# Patient Record
Sex: Female | Born: 1975 | Race: White | Hispanic: No | Marital: Married | Smoking: Former smoker
Health system: Southern US, Community
[De-identification: ages and names within clinical notes are randomized; demographics above are authoritative.]

## PROBLEM LIST (undated history)

## (undated) DIAGNOSIS — F419 Anxiety disorder, unspecified: Secondary | ICD-10-CM

## (undated) DIAGNOSIS — F32A Depression, unspecified: Secondary | ICD-10-CM

## (undated) DIAGNOSIS — D649 Anemia, unspecified: Secondary | ICD-10-CM

## (undated) DIAGNOSIS — R011 Cardiac murmur, unspecified: Secondary | ICD-10-CM

## (undated) DIAGNOSIS — R002 Palpitations: Secondary | ICD-10-CM

## (undated) DIAGNOSIS — F329 Major depressive disorder, single episode, unspecified: Secondary | ICD-10-CM

## (undated) DIAGNOSIS — I10 Essential (primary) hypertension: Secondary | ICD-10-CM

## (undated) DIAGNOSIS — N83209 Unspecified ovarian cyst, unspecified side: Secondary | ICD-10-CM

## (undated) HISTORY — DX: Essential (primary) hypertension: I10

## (undated) HISTORY — DX: Anemia, unspecified: D64.9

## (undated) HISTORY — DX: Anxiety disorder, unspecified: F41.9

## (undated) HISTORY — DX: Depression, unspecified: F32.A

## (undated) HISTORY — PX: CYSTECTOMY: SUR359

## (undated) HISTORY — DX: Palpitations: R00.2

## (undated) HISTORY — DX: Unspecified ovarian cyst, unspecified side: N83.209

## (undated) HISTORY — DX: Major depressive disorder, single episode, unspecified: F32.9

---

## 2000-08-24 ENCOUNTER — Other Ambulatory Visit: Admission: RE | Admit: 2000-08-24 | Discharge: 2000-08-24 | Payer: Self-pay | Admitting: Obstetrics & Gynecology

## 2001-10-13 ENCOUNTER — Other Ambulatory Visit: Admission: RE | Admit: 2001-10-13 | Discharge: 2001-10-13 | Payer: Self-pay | Admitting: Obstetrics & Gynecology

## 2002-05-03 ENCOUNTER — Other Ambulatory Visit: Admission: RE | Admit: 2002-05-03 | Discharge: 2002-05-03 | Payer: Self-pay | Admitting: Obstetrics & Gynecology

## 2002-05-03 ENCOUNTER — Other Ambulatory Visit: Admission: RE | Admit: 2002-05-03 | Discharge: 2002-05-03 | Payer: Self-pay | Admitting: Obstetrics and Gynecology

## 2002-10-23 ENCOUNTER — Other Ambulatory Visit: Admission: RE | Admit: 2002-10-23 | Discharge: 2002-10-23 | Payer: Self-pay | Admitting: Obstetrics & Gynecology

## 2003-06-17 ENCOUNTER — Other Ambulatory Visit: Admission: RE | Admit: 2003-06-17 | Discharge: 2003-06-17 | Payer: Self-pay | Admitting: Obstetrics & Gynecology

## 2003-12-20 ENCOUNTER — Other Ambulatory Visit: Admission: RE | Admit: 2003-12-20 | Discharge: 2003-12-20 | Payer: Self-pay | Admitting: Obstetrics & Gynecology

## 2004-06-23 ENCOUNTER — Other Ambulatory Visit: Admission: RE | Admit: 2004-06-23 | Discharge: 2004-06-23 | Payer: Self-pay | Admitting: Obstetrics and Gynecology

## 2004-12-24 ENCOUNTER — Emergency Department (HOSPITAL_COMMUNITY): Admission: EM | Admit: 2004-12-24 | Discharge: 2004-12-24 | Payer: Self-pay | Admitting: Emergency Medicine

## 2004-12-25 ENCOUNTER — Emergency Department (HOSPITAL_COMMUNITY): Admission: EM | Admit: 2004-12-25 | Discharge: 2004-12-25 | Payer: Self-pay | Admitting: Emergency Medicine

## 2005-03-17 ENCOUNTER — Other Ambulatory Visit: Admission: RE | Admit: 2005-03-17 | Discharge: 2005-03-17 | Payer: Self-pay | Admitting: Obstetrics & Gynecology

## 2006-07-27 ENCOUNTER — Ambulatory Visit: Payer: Self-pay | Admitting: Internal Medicine

## 2006-08-17 ENCOUNTER — Ambulatory Visit: Payer: Self-pay | Admitting: Internal Medicine

## 2007-12-18 ENCOUNTER — Inpatient Hospital Stay (HOSPITAL_COMMUNITY): Admission: AD | Admit: 2007-12-18 | Discharge: 2007-12-18 | Payer: Self-pay | Admitting: Obstetrics and Gynecology

## 2008-04-22 ENCOUNTER — Inpatient Hospital Stay (HOSPITAL_COMMUNITY): Admission: AD | Admit: 2008-04-22 | Discharge: 2008-04-22 | Payer: Self-pay | Admitting: Obstetrics and Gynecology

## 2008-04-26 ENCOUNTER — Inpatient Hospital Stay (HOSPITAL_COMMUNITY): Admission: RE | Admit: 2008-04-26 | Discharge: 2008-04-28 | Payer: Self-pay | Admitting: Obstetrics & Gynecology

## 2008-05-11 ENCOUNTER — Emergency Department (HOSPITAL_COMMUNITY): Admission: EM | Admit: 2008-05-11 | Discharge: 2008-05-12 | Payer: Self-pay | Admitting: Emergency Medicine

## 2010-07-20 LAB — COMPREHENSIVE METABOLIC PANEL
AST: 22 U/L (ref 0–37)
AST: 23 U/L (ref 0–37)
Albumin: 3 g/dL — ABNORMAL LOW (ref 3.5–5.2)
BUN: 5 mg/dL — ABNORMAL LOW (ref 6–23)
CO2: 22 mEq/L (ref 19–32)
CO2: 22 mEq/L (ref 19–32)
Chloride: 103 mEq/L (ref 96–112)
Creatinine, Ser: 0.49 mg/dL (ref 0.4–1.2)
GFR calc Af Amer: >60 mL/min (ref >60)
GFR calc non Af Amer: >60 mL/min (ref >60)
GFR calc non Af Amer: >60 mL/min (ref >60)
Potassium: 4.3 mEq/L (ref 3.5–5.1)
Potassium: 4.3 mEq/L (ref 3.5–5.1)
Sodium: 132 mEq/L — ABNORMAL LOW (ref 135–145)
Total Bilirubin: 0.5 mg/dL (ref 0.3–1.2)
Total Protein: 6.4 g/dL (ref 6.0–8.3)

## 2010-07-20 LAB — CBC
HCT: 42.3 % (ref 36.0–46.0)
MCHC: 32.5 g/dL (ref 30.0–36.0)
MCHC: 33.7 g/dL (ref 30.0–36.0)
MCV: 83.7 fL (ref 78.0–100.0)
Platelets: 179 10*3/uL (ref 150–400)
Platelets: 198 10*3/uL (ref 150–400)
RBC: 3.81 MIL/uL — ABNORMAL LOW (ref 3.87–5.11)
RBC: 4.91 MIL/uL (ref 3.87–5.11)
WBC: 14 10*3/uL — ABNORMAL HIGH (ref 4.0–10.5)
WBC: 19 10*3/uL — ABNORMAL HIGH (ref 4.0–10.5)

## 2010-07-21 LAB — TYPE AND SCREEN
ABO/RH(D): O POS
Antibody Screen: NEGATIVE

## 2010-07-21 LAB — CBC
Platelets: 362 10*3/uL (ref 150–400)
RDW: 16.1 % — ABNORMAL HIGH (ref 11.5–15.5)
WBC: 10.5 10*3/uL (ref 4.0–10.5)

## 2010-07-21 LAB — URINALYSIS, ROUTINE W REFLEX MICROSCOPIC
Bilirubin Urine: NEGATIVE
Hgb urine dipstick: NEGATIVE
Ketones, ur: NEGATIVE mg/dL
Nitrite: NEGATIVE
Specific Gravity, Urine: 1.027 (ref 1.005–1.030)

## 2010-07-21 LAB — DIFFERENTIAL
Basophils Relative: 0 % (ref 0–1)
Eosinophils Absolute: 0.4 10*3/uL (ref 0.0–0.7)
Eosinophils Relative: 3 % (ref 0–5)
Lymphocytes Relative: 37 % (ref 12–46)
Lymphs Abs: 3.8 10*3/uL (ref 0.7–4.0)
Monocytes Absolute: 0.7 10*3/uL (ref 0.1–1.0)
Neutro Abs: 5.6 10*3/uL (ref 1.7–7.7)
Neutrophils Relative %: 53 % (ref 43–77)

## 2010-07-21 LAB — PROTIME-INR: INR: 1.1 (ref 0.00–1.49)

## 2010-08-21 NOTE — Assessment & Plan Note (Signed)
[facility]                         GASTROENTEROLOGY OFFICE NOTE   Leslie, Mathews                     MRN:          [id number]  DATE:07/27/2006                            DOB:          09/20/1975    REASON FOR CONSULTATION:  Hematochezia and Hemoccult positive stool.   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 Clelia Croft regarding rectal bleeding and  Hemoccult positive stool.  The patient reports being in her usual state  of good health until approximately 3 weeks ago, when she began to notice  some red blood on the tissue only.  Subsequently, she noticed red and  maroon blood associated with her stool.  She described it as burgundy or  jelly-like, similar to blood from menstruation.  There was no associated  abdominal or rectal pain.  The problem persisted for about 1 week, then  resolved.  She was seen by Dr. Clelia Croft, who performed rectal examination  with no evidence of fissure or hemorrhoid.  Her stool, however, was  Hemoccult positive.  She also reported about a month ago having had  black tarry stools.  No complaints of abdominal pain.  As stated though,  she had some tenderness in the epigastric region on physical exam.  A  CBC obtained July 21, 2006 revealed a normal hemoglobin at 13.9.  Her  MCV was also normal at 81.2.  Ferritin was normal at 54.1.  Helicobacter  pylori was negative.  Her iron saturation was low at 13%.  Patient has  had no problems this past week with normal looking stools.  She denies  heartburn, dysphagia, or weight loss.  She has had some intermittent  loose stools, which she attributes to anxiety.  She states this has  improved with increasing doses of Lexapro.   PAST MEDICAL HISTORY:  Anxiety with depression.   PAST SURGICAL HISTORY:  LEAP procedure 2 years ago.   ALLERGIES:  NO  KNOWN DRUG ALLERGIES.   CURRENT MEDICATIONS:  Lexapro 20 mg daily.   FAMILY HISTORY:  Negative for gastrointestinal malignancy or  inflammatory bowel disease.   SOCIAL HISTORY:  Patient is married without children.  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  She smokes 5  cigarettes per day, and has 2 alcoholic beverages at night.  Mentions  occasional marijuana use.   REVIEW OF SYSTEMS:  Per diagnostic evaluation form.   PHYSICAL EXAMINATION:  Well-appearing female, in no acute distress.  Blood pressure 122/84.  Heart rate is 80. Weight is 171.8 pounds.  She  is 5 feet 6 inches in height.  HEENT:  Sclerae anicteric.  Conjunctivae are pink.  Oral mucosa intact.  No adenopathy.  LUNGS:  Clear.  HEART:  Regular.  ABDOMEN:  Soft without tenderness, mass or hernia.  Good bowel sounds  heard.  RECTAL EXAM:  Per Dr. Clelia Croft July 21, 2006.  Not repeated.  EXTREMITIES:  Without edema.   IMPRESSION:  This is a 35 year old female with recent problems with  lower gastrointestinal bleeding without obvious cause.  As well,  transient dark stools raising the question of melena.  Her hemoglobin is  normal, and her ferritin normal.  The iron saturation is slightly low  raising the possible question of chronic blood loss.  She should be  evaluated to rule out colon polyp or neoplasia.   RECOMMENDATIONS:  Colonoscopy.  Ashby Dawes of the procedure, as well the  risks, benefits, and alternatives have been reviewed.  She understood  and agreed to proceed.  If colonoscopy negative, then I would indeed  proceed with upper endoscopy to address possible melena in a Hemoccult  positive stool.  We discussed this as well.     Wilhemina Bonito. Marina Goodell, MD  Electronically Signed    JNP/MedQ  DD: 07/27/2006  DT: 07/27/2006  Job #: [id number]   cc:   Kari Baars, M.D.

## 2011-01-06 LAB — URINALYSIS, ROUTINE W REFLEX MICROSCOPIC
Nitrite: NEGATIVE
Protein, ur: NEGATIVE
Urobilinogen, UA: 0.2

## 2011-02-12 ENCOUNTER — Ambulatory Visit: Payer: BC Managed Care – PPO | Attending: Gynecology | Admitting: Gynecology

## 2011-02-12 ENCOUNTER — Encounter: Payer: Self-pay | Admitting: Gynecology

## 2011-02-12 VITALS — BP 124/80 | HR 80 | Temp 99.0°F | Resp 14 | Ht 67.72 in | Wt 190.5 lb

## 2011-02-12 DIAGNOSIS — N83209 Unspecified ovarian cyst, unspecified side: Secondary | ICD-10-CM

## 2011-02-12 DIAGNOSIS — N949 Unspecified condition associated with female genital organs and menstrual cycle: Secondary | ICD-10-CM | POA: Insufficient documentation

## 2011-02-12 DIAGNOSIS — R109 Unspecified abdominal pain: Secondary | ICD-10-CM | POA: Insufficient documentation

## 2011-02-12 DIAGNOSIS — N839 Noninflammatory disorder of ovary, fallopian tube and broad ligament, unspecified: Secondary | ICD-10-CM | POA: Insufficient documentation

## 2011-02-12 HISTORY — DX: Unspecified ovarian cyst, unspecified side: N83.209

## 2011-02-12 NOTE — Progress Notes (Signed)
Consult Note: Gyn-Onc  Leslie Mathews 35 y.o. female  CC:  Chief Complaint  Patient presents with  . Cyst    New pt     HPI:  35 year old white married female gravida 1 who is seen in consultation at the request of Dr. Varney Baas regarding the management of a complex adnexal mass.  The patient has had lower abdominal and pelvic pain nearly continuously since the birth of her daughter 3 years ago. This is somewhat positional especially when she is recumbent. She was seen recently for evaluation by Dr. Jennette Kettle who obtained an ultrasound that showed an enlarged left ovary measuring 3.4 x 2.8 x 3.4 cm. The mass was partially cystic and partially solid. It was thought to possibly be a dermoid there was no internal blood flow within the mass. There is no free fluid. She did have an OVA-1 which measured 5.4 which is slightly higher than normal for premenopausal patient.  The patient has a past gynecologic history of an abnormal Pap smear undergoing what sounds like a LEEP procedure. Subsequent Paps have been normal. She does not have a family history of ovarian or breast cancer . Her mother had endometriosis. The patient denies that her pain is cyclic but is pretty much continuously present. She denies any other GI or GU symptoms and overall her functional status is excellent.      Review of Systems: 10 point review of systems is negative except as noted above   Current Meds:  Outpatient Encounter Prescriptions as of 02/12/2011  Medication Sig Dispense Refill  . etonogestrel-ethinyl estradiol (NUVARING) 0.12-0.015 MG/24HR vaginal ring Place 1 each vaginally every 28 (twenty-eight) days. Insert vaginally and leave in place for 3 consecutive weeks, then remove for 1 week.       Marland Kitchen HYDROcodone-ibuprofen (VICOPROFEN) 7.5-200 MG per tablet Take 0.5 tablets by mouth daily as needed.          Allergy:  Allergies  Allergen Reactions  . Penicillins Rash    As a child    Social Hx:   History    Social History  . Marital Status: Married    Spouse Name: N/A    Number of Children: N/A  . Years of Education: N/A   Occupational History  . Not on file.   Social History Main Topics  . Smoking status: Former Smoker    Quit date: 02/11/1997  . Smokeless tobacco: Not on file  . Alcohol Use: 3.5 oz/week    7 drink(s) per week     Glass of wine everyday  . Drug Use: No  . Sexually Active: Yes   Other Topics Concern  . Not on file   Social History Narrative  . No narrative on file    Past Surgical Hx: History reviewed. No pertinent past surgical history.  Past Medical Hx:  Past Medical History  Diagnosis Date  . Anemia     in teenage yrs  . Anxiety     Depression in the past  . Delivery normal   . Heart palpitations     Family Hx:  Family History  Problem Relation Age of Onset  . Hypertension Mother   . ALS Mother   . Hyperlipidemia Mother   . Diabetes Father   . Multiple myeloma Father   . Hyperlipidemia Brother   . Hypertension Brother     Vitals:  Blood pressure 124/80, pulse 80, temperature 99 F (37.2 C), temperature source Oral, resp. rate 14, height 5' 7.72" (1.72 m), weight  190 lb 8 oz (86.41 kg), last menstrual period 02/10/2011.  Physical Exam: the patient is a healthy white female in no acute distress  HEENT is negative  Neck is supple without thyromegaly  There is no supraclavicular or inguinal adenopathy.  The abdomen is obese soft nontender no mass or megaly ascites or hernias are noted.  Pelvic exam  EGBUS vagina bladder and urethra are normal  The cervix appears normal  Uterus is anterior normal shape size and consistency.  Adnexa are without palpable masses.  Lower extremities without edema or varicosities   Assessment/Plan: complex adnexal mass in  a premenopausal patient. 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 Her chronic pelvic  pain may point towards endometriosis.  I have contacted Dr. Konrad Dolores and have encouraged him to proceed with surgery. I discussed with the patient that is unlikely this is a malignancy and therefore ovarian cystectomy was most reasonable. If, in fact, this does return as ovarian cancer on final pathology we would proceed with staging at another time. Patient is in agreement with this plan and will contact Dr. Jennette Kettle to arrange a surgical date.    Jeannette Corpus, MD 02/12/2011, 5:15 PM

## 2011-02-12 NOTE — Patient Instructions (Signed)
Contact Dr. Donnetta Hail office to schedule surgery

## 2011-02-17 ENCOUNTER — Other Ambulatory Visit: Payer: Self-pay | Admitting: Obstetrics & Gynecology

## 2011-02-17 ENCOUNTER — Other Ambulatory Visit (HOSPITAL_COMMUNITY)
Admission: RE | Admit: 2011-02-17 | Discharge: 2011-02-17 | Disposition: A | Discharge status: Home / Self Care | Payer: BC Managed Care – PPO | Source: Ambulatory Visit | Attending: Obstetrics & Gynecology | Admitting: Obstetrics & Gynecology

## 2011-02-17 DIAGNOSIS — N83209 Unspecified ovarian cyst, unspecified side: Secondary | ICD-10-CM | POA: Insufficient documentation

## 2011-03-12 ENCOUNTER — Emergency Department (HOSPITAL_COMMUNITY)
Admission: EM | Admit: 2011-03-12 | Discharge: 2011-03-12 | Disposition: A | Discharge status: Home / Self Care | Payer: BC Managed Care – PPO | Attending: Emergency Medicine | Admitting: Emergency Medicine

## 2011-03-12 ENCOUNTER — Encounter (HOSPITAL_COMMUNITY): Payer: Self-pay | Admitting: Emergency Medicine

## 2011-03-12 DIAGNOSIS — R51 Headache: Secondary | ICD-10-CM | POA: Insufficient documentation

## 2011-03-12 DIAGNOSIS — I10 Essential (primary) hypertension: Secondary | ICD-10-CM | POA: Insufficient documentation

## 2011-03-12 DIAGNOSIS — F410 Panic disorder [episodic paroxysmal anxiety] without agoraphobia: Secondary | ICD-10-CM

## 2011-03-12 DIAGNOSIS — T887XXA Unspecified adverse effect of drug or medicament, initial encounter: Secondary | ICD-10-CM | POA: Insufficient documentation

## 2011-03-12 DIAGNOSIS — IMO0002 Reserved for concepts with insufficient information to code with codable children: Secondary | ICD-10-CM

## 2011-03-12 DIAGNOSIS — R110 Nausea: Secondary | ICD-10-CM | POA: Insufficient documentation

## 2011-03-12 NOTE — ED Notes (Signed)
AVW:[id number]<JY> Expected date:03/12/11<BR> Expected time: 1:23 PM<BR> Means of arrival:Ambulance<BR> Comments:<BR> M100 - 67 yoM Abd inj/Fall CA Pt

## 2011-03-12 NOTE — ED Notes (Signed)
Pt left with friend and walked to d/c window. Pt stated "I feel better now".

## 2011-03-12 NOTE — ED Provider Notes (Signed)
History     CSN: [id number] Arrival date & time: 03/12/2011  1:19 PM   First MD Initiated Contact with Patient 03/12/11 1514      Chief Complaint  Patient presents with  . Medication Reaction    (Consider location/radiation/quality/duration/timing/severity/associated sxs/prior treatment) Patient is a 35 y.o. female presenting with mental health disorder. The history is provided by the patient.  Mental Health Problem Primary symptoms comment: panic attack The current episode started today. This is a recurrent problem.  The onset of the illness is precipitated by a stressful event (took 1/2 tramadol and 2 excedrin tension today and felt very jittery which sent her into a panic attack). The degree of incapacity that she is experiencing as a consequence of her illness is mild. Additional symptoms of the illness include headaches. She does not admit to suicidal ideas. She does not contemplate harming herself. She has not already injured self. She has not already  injured another person. Risk factors: hx of panic attacks.    Past Medical History  Diagnosis Date  . Anemia     in teenage yrs  . Anxiety     Depression in the past  . Delivery normal   . Heart palpitations   . Ovarian cyst 02/12/2011    Past Surgical History  Procedure Date  . Cystectomy     Family History  Problem Relation Age of Onset  . Hypertension Mother   . ALS Mother   . Hyperlipidemia Mother   . Diabetes Father   . Multiple myeloma Father   . Hyperlipidemia Brother   . Hypertension Brother     History  Substance Use Topics  . Smoking status: Former Smoker    Quit date: 02/11/1997  . Smokeless tobacco: Not on file  . Alcohol Use: 3.5 oz/week    7 drink(s) per week     Glass of wine everyday    OB History    Grav Para Term Preterm Abortions TAB SAB Ect Mult Living                  Review of Systems  HENT: Negative for neck pain.   Eyes: Negative for photophobia and visual disturbance.    Respiratory: Negative for cough and shortness of breath.   Cardiovascular: Negative for chest pain.  Gastrointestinal: Positive for nausea. Negative for vomiting and diarrhea.  Neurological: Positive for headaches.       Woke up this am with a headache which has not improved.  Causing mild nausea     Allergies  Penicillins  Home Medications   Current Outpatient Rx  Name Route Sig Dispense Refill  . ACETAMINOPHEN-CAFFEINE 500-65 MG PO TABS Oral Take 2 tablets by mouth daily as needed. migraine     . ALPRAZOLAM 0.5 MG PO TABS Oral Take 0.5 mg by mouth every 8 (eight) hours. Pt takes 1/2 to 1 tab as needed     . ETONOGESTREL-ETHINYL ESTRADIOL 0.12-0.015 MG/24HR VA RING Vaginal Place 1 each vaginally every 28 (twenty-eight) days. Insert vaginally and leave in place for 3 consecutive weeks, then remove for 1 week.     Marland Kitchen HYDROCODONE-IBUPROFEN 7.5-200 MG PO TABS Oral Take 0.5 tablets by mouth daily as needed.      Marland Kitchen TRAMADOL HCL 50 MG PO TBDP Oral Take 50 mg by mouth daily.        BP 137/86  Pulse 86  Temp(Src) 98.7 F (37.1 C) (Oral)  Resp 18  SpO2 100%  LMP 02/10/2011  Physical  Exam  Nursing note and vitals reviewed. Constitutional: She is oriented to person, place, and time. She appears well-developed and well-nourished. No distress.  HENT:  Head: Normocephalic and atraumatic.  Eyes: EOM are normal. Pupils are equal, round, and reactive to light.  Cardiovascular: Normal rate, regular rhythm, normal heart sounds and intact distal pulses.  Exam reveals no friction rub.   No murmur heard. Pulmonary/Chest: Effort normal and breath sounds normal. She has no wheezes. She has no rales.  Abdominal: Soft. Bowel sounds are normal. She exhibits no distension. There is no tenderness. There is no rebound and no guarding.  Musculoskeletal: Normal range of motion. She exhibits no tenderness.       No edema  Neurological: She is alert and oriented to person, place, and time. No cranial nerve  deficit.  Skin: Skin is warm and dry. No rash noted.  Psychiatric: She has a normal mood and affect. Her behavior is normal. Judgment and thought content normal.    ED Course  Procedures (including critical care time)  Labs Reviewed - No data to display No results found.   No diagnosis found.    MDM   Pt states she is having a panic attack after taking 1/2 tramadol and 2 excedrin tension.  She states she thinks it is due to all the caffiene she has had today.  Feeling better now but states still a little jittery.  Pt states HA is a classic tension HA.  No migraine sx and no sx suggestive of SAH.  Pt is well appearing and states she is starting to calm down.  Pt has hx of panic attacks and usually takes xanax when they get bad but was scared to take xanax today due to the other meds.  Exam wnl. Will d/c pt home as she is improving on her own and normal VS.       Gwyneth Sprout, MD 03/12/11 [phone]

## 2011-03-12 NOTE — ED Notes (Signed)
Pt presenting to ed with c/o taking possibly a 1/2 tramadol and 2 excedrin tension headache. Pt states she thought it would help her headache pt with c/o feeling, nauseous, feels like she needs to have a bowel movement. Pt states she feels like she is about to have a panic attack. Pt states she continues to have a headache

## 2013-01-18 ENCOUNTER — Other Ambulatory Visit: Payer: Self-pay | Admitting: Dermatology

## 2013-09-25 ENCOUNTER — Encounter: Payer: Self-pay | Admitting: Internal Medicine

## 2013-10-11 ENCOUNTER — Ambulatory Visit (INDEPENDENT_AMBULATORY_CARE_PROVIDER_SITE_OTHER): Payer: BC Managed Care – PPO | Admitting: Family Medicine

## 2013-10-11 ENCOUNTER — Telehealth: Payer: Self-pay

## 2013-10-11 VITALS — BP 118/76 | HR 76 | Temp 99.1°F | Resp 16 | Ht 65.25 in | Wt 174.2 lb

## 2013-10-11 DIAGNOSIS — J029 Acute pharyngitis, unspecified: Secondary | ICD-10-CM

## 2013-10-11 LAB — POCT RAPID STREP A (OFFICE): RAPID STREP A SCREEN: NEGATIVE

## 2013-10-11 MED ORDER — AZITHROMYCIN 250 MG PO TABS: ORAL_TABLET | ORAL | Status: DC

## 2013-10-11 MED ORDER — CLINDAMYCIN HCL 300 MG PO CAPS: 300.0000 mg | ORAL_CAPSULE | Freq: Three times a day (TID) | ORAL | Status: DC

## 2013-10-11 NOTE — Patient Instructions (Signed)
We are going to treat you for strep throat with azithromycin.  I will be in touch with your throat culture when it comes in.  Drink plenty of fluids and take ibuprofen and/or tylenol as needed.    Let me know if you are feeling worse or have any change in your symptoms.

## 2013-10-11 NOTE — Telephone Encounter (Signed)
Pt was here today and received a rx for Zithromax and went to the pool and then started to notice a red rash. She said she has taken this medication before with no issues. She's not sure if it is a reaction or not. She can be reached at [phone]. Thank you

## 2013-10-11 NOTE — Progress Notes (Addendum)
[facility] [address], [place] [address] [phone]  Date:  10/11/2013   Name:  Leslie Mathews   DOB:  November 14, 1975   MRN:  [id number]  PCP:  No PCP Per Patient    Chief Complaint: Sore Throat   History of Present Illness:  Leslie Mathews is a 38 y.o. very pleasant female patient who presents with the following:  She has noted a ST, low grade fever and body aches for 2 to 3 days.  Her temp has gotten up to around 100 at home.   No other sx that she has noted.  No cough, runny nose, sneezing, nausea or vomiting.   She is generally healthy pcn allergic - able to tolerate a zpack ok She is on nuvaring for contraception  Patient Active Problem List   Diagnosis Date Noted  . Ovarian cyst 02/12/2011    Past Medical History  Diagnosis Date  . Anemia     in teenage yrs  . Anxiety     Depression in the past  . Delivery normal   . Heart palpitations   . Ovarian cyst 02/12/2011    Past Surgical History  Procedure Laterality Date  . Cystectomy      History  Substance Use Topics  . Smoking status: Former Smoker    Quit date: 02/11/1997  . Smokeless tobacco: Not on file  . Alcohol Use: 3.5 oz/week    7 drink(s) per week     Comment: Glass of wine everyday    Family History  Problem Relation Age of Onset  . Hypertension Mother   . ALS Mother   . Hyperlipidemia Mother   . Diabetes Father   . Multiple myeloma Father   . Hyperlipidemia Brother   . Hypertension Brother     Allergies  Allergen Reactions  . Penicillins Rash    As a child    Medication list has been reviewed and updated.  Current Outpatient Prescriptions on File Prior to Visit  Medication Sig Dispense Refill  . ALPRAZolam (XANAX) 0.5 MG tablet Take 0.5 mg by mouth every 8 (eight) hours. Pt takes 1/2 to 1 tab as needed       . etonogestrel-ethinyl estradiol (NUVARING) 0.12-0.015 MG/24HR vaginal ring Place 1 each vaginally every 28 (twenty-eight) days. Insert vaginally  and leave in place for 3 consecutive weeks, then remove for 1 week.       . Acetaminophen-Caffeine (EXCEDRIN TENSION HEADACHE) 500-65 MG TABS Take 2 tablets by mouth daily as needed. migraine       . HYDROcodone-ibuprofen (VICOPROFEN) 7.5-200 MG per tablet Take 0.5 tablets by mouth daily as needed.        . TraMADol HCl 50 MG TBDP Take 50 mg by mouth daily.         No current facility-administered medications on file prior to visit.    Review of Systems:  As per HPI- otherwise negative.   Physical Examination: Filed Vitals:   10/11/13 0951  BP: 118/76  Pulse: 76  Temp: 99.1 F (37.3 C)  Resp: 16   Filed Vitals:   10/11/13 0951  Height: 5' 5.25" (1.657 m)  Weight: 174 lb 3.2 oz (79.017 kg)   Body mass index is 28.78 kg/(m^2). Ideal Body Weight: Weight in (lb) to have BMI = 25: 151.1  GEN: WDWN, NAD, Non-toxic, A & O x 3, looks well HEENT: Atraumatic, Normocephalic. Neck supple. No masses, No LAD.  Bilateral TM wnl, oropharynx shows inflammation and exudate.  PEERL,EOMI.   Ears and Nose: No external deformity. CV: RRR, No M/G/R. No JVD. No thrill. No extra heart sounds. PULM: CTA B, no wheezes, crackles, rhonchi. No retractions. No resp. distress. No accessory muscle use. ABD: S, NT, ND EXTR: No c/c/e NEURO Normal gait.  PSYCH: Normally interactive. Conversant. Not depressed or anxious appearing.  Calm demeanor.   Results for orders placed in visit on 10/11/13  POCT RAPID STREP A (OFFICE)      Result Value Ref Range   Rapid Strep A Screen Negative  Negative    Assessment and Plan: Acute pharyngitis, unspecified pharyngitis type - Plan: POCT rapid strep A, Culture, Group A Strep, azithromycin (ZITHROMAX) 250 MG tablet  Treat for likely strep throat with azithromycin due to pcn allergy.  Await culture and will follow-up with her.    Signed Lamar Blinks, MD  7/11:  Results for orders placed in visit on 10/11/13  CULTURE, GROUP A STREP      Result Value Ref  Range   Organism ID, Bacteria Normal Upper Respiratory Flora     Organism ID, Bacteria No Beta Hemolytic Streptococci Isolated    POCT RAPID STREP A (OFFICE)      Result Value Ref Range   Rapid Strep A Screen Negative  Negative   Called and LMOM- throat culture negative for strep.  Finish abx and let me know if not better

## 2013-10-11 NOTE — Telephone Encounter (Signed)
Called to discuss with her.  She is not having any SOB or hives, the rash is not itchy.  However will have her DC the azithromycin and due clindamycin instead.  She may have a mild allergy to azithromycin so she will cautious of using this in the future.

## 2013-10-11 NOTE — Telephone Encounter (Signed)
Spoke to pt- she states the rash is everywhere. She is still in the water and at the pool. But when she is in the shade it seems it is getting better. She says it looks like she has little pimples all over her body. She denies any SOB, itching or hive appearance. She has taken a Z pak before and did not have any reaction. She is also taking a B complex and Magnesium and a multi-vitiman. She is wondering if they might have an interaction. Should she proceed with treatment or stop medication. Advised her to stop and we would call her back before she  Needed to take the second dose.

## 2013-10-13 LAB — CULTURE, GROUP A STREP: Organism ID, Bacteria: NORMAL

## 2013-12-30 ENCOUNTER — Ambulatory Visit (INDEPENDENT_AMBULATORY_CARE_PROVIDER_SITE_OTHER): Payer: BC Managed Care – PPO | Admitting: Internal Medicine

## 2013-12-30 ENCOUNTER — Encounter: Payer: Self-pay | Admitting: Internal Medicine

## 2013-12-30 VITALS — HR 61 | Temp 98.2°F | Resp 16 | Ht 65.5 in | Wt 177.6 lb

## 2013-12-30 DIAGNOSIS — R1031 Right lower quadrant pain: Secondary | ICD-10-CM

## 2013-12-30 LAB — POCT UA - MICROSCOPIC ONLY
Bacteria, U Microscopic: NEGATIVE
Casts, Ur, LPF, POC: NEGATIVE
Crystals, Ur, HPF, POC: NEGATIVE
Mucus, UA: NEGATIVE
YEAST UA: NEGATIVE

## 2013-12-30 LAB — POCT URINALYSIS DIPSTICK
Bilirubin, UA: NEGATIVE
GLUCOSE UA: NEGATIVE
Ketones, UA: NEGATIVE
LEUKOCYTES UA: NEGATIVE
NITRITE UA: NEGATIVE
Protein, UA: NEGATIVE
Spec Grav, UA: 1.02
Urobilinogen, UA: 0.2
pH, UA: 6

## 2013-12-30 MED ORDER — MELOXICAM 15 MG PO TABS: 15.0000 mg | ORAL_TABLET | Freq: Every day | ORAL | Status: DC

## 2013-12-30 NOTE — Progress Notes (Signed)
Subjective:   This chart was scribed for Ellamae Sia, MD by Jarvis Morgan, Medical Scribe. This patient was seen in Room 2 and the patient's care was started at 3:17 PM.   Patient ID: Leslie Mathews, female    DOB: 10/01/1975, 38 y.o.   MRN: [id number]  Chief Complaint  Patient presents with  . Back Pain    x 1 day   . Abdominal Pain    x 1 day     HPI HPI Comments: Leslie Mathews is a 38 y.o. female who presents to the [facility] complaining of excruciating back and abdominal pain for 1 day. She states that the pain. Starts in her back and radiates to her abdomen. The pain in her abdomen is localized in the right side and periumbilical region. She states that it felt like menstrual cramps but she does not typically have menstrual cramps. She is due to start her menstrual period this week. She notes some associated increase in frequency, she does note she drinks a lot of water so that could be contributory, as well as nausea. Has a h/o ovarian cysts. She denies any fever, emesis, dysuria, hematuria, or flank pain.   Review of Systems A complete 10 system review of systems was obtained and all systems are negative except as noted in the HPI and PMH.      Objective:   Physical Exam  Nursing note and vitals reviewed. Constitutional: She is oriented to person, place, and time. She appears well-developed and well-nourished. No distress.  NAD  HENT:  Head: Normocephalic and atraumatic.  Eyes: Conjunctivae and EOM are normal.  Neck: Neck supple. No tracheal deviation present.  Cardiovascular: Normal rate.   Pulmonary/Chest: Effort normal. No respiratory distress.  Abdominal: Soft. Bowel sounds are normal. She exhibits no distension and no mass. There is tenderness. There is no rebound and no guarding.  Tenderness to palpation in RLQ.   Genitourinary: Vagina normal and uterus normal. Uterus is not enlarged and not tender. Right adnexum displays tenderness.  Right adnexum displays no mass. Left adnexum displays no mass and no tenderness.  External exam normal  Musculoskeletal: Normal range of motion.  Neurological: She is alert and oriented to person, place, and time.  Skin: Skin is warm and dry.  Psychiatric: She has a normal mood and affect. Her behavior is normal.    Filed Vitals:   12/30/13 1346  Pulse: 61  Resp: 16  Height: 5' 5.5" (1.664 m)  Weight: 177 lb 9.6 oz (80.559 kg)  SpO2: 99%   Results for orders placed in visit on 12/30/13  POCT UA - MICROSCOPIC ONLY      Result Value Ref Range   WBC, Ur, HPF, POC 0-1     RBC, urine, microscopic 0-2     Bacteria, U Microscopic neg     Mucus, UA neg     Epithelial cells, urine per micros 0-1     Crystals, Ur, HPF, POC neg     Casts, Ur, LPF, POC neg     Yeast, UA neg    POCT URINALYSIS DIPSTICK      Result Value Ref Range   Color, UA yellow     Clarity, UA clear     Glucose, UA neg     Bilirubin, UA neg     Ketones, UA neg     Spec Grav, UA 1.020     Blood, UA small     pH, UA 6.0  Protein, UA neg     Urobilinogen, UA 0.2     Nitrite, UA neg     Leukocytes, UA Negative          Assessment & Plan:  Right lower quadrant abdominal pain - Plan: POCT UA - Microscopic Only, POCT urinalysis dipstick  Due to ruptured Ovar cyst Meds ordered this encounter  Medications  . meloxicam (MOBIC) 15 MG tablet    Sig: Take 1 tablet (15 mg total) by mouth daily.    Dispense:  10 tablet    Refill:  0   F/u 1 week for imaging if not well

## 2014-01-04 ENCOUNTER — Other Ambulatory Visit: Payer: Self-pay | Admitting: Internal Medicine

## 2014-01-04 DIAGNOSIS — R110 Nausea: Secondary | ICD-10-CM

## 2014-01-04 DIAGNOSIS — R1084 Generalized abdominal pain: Secondary | ICD-10-CM

## 2014-01-07 ENCOUNTER — Other Ambulatory Visit: Payer: BC Managed Care – PPO

## 2014-09-24 ENCOUNTER — Other Ambulatory Visit: Payer: Self-pay | Admitting: Obstetrics and Gynecology

## 2014-09-30 LAB — CYTOLOGY - PAP

## 2016-08-09 DIAGNOSIS — R030 Elevated blood-pressure reading, without diagnosis of hypertension: Secondary | ICD-10-CM | POA: Diagnosis not present

## 2016-08-09 DIAGNOSIS — R002 Palpitations: Secondary | ICD-10-CM | POA: Diagnosis not present

## 2016-08-09 DIAGNOSIS — R011 Cardiac murmur, unspecified: Secondary | ICD-10-CM | POA: Diagnosis not present

## 2016-09-01 DIAGNOSIS — Z01419 Encounter for gynecological examination (general) (routine) without abnormal findings: Secondary | ICD-10-CM | POA: Diagnosis not present

## 2016-09-15 DIAGNOSIS — M545 Low back pain: Secondary | ICD-10-CM | POA: Diagnosis not present

## 2016-10-05 ENCOUNTER — Encounter: Payer: Self-pay | Admitting: Genetics

## 2016-10-05 ENCOUNTER — Other Ambulatory Visit: Payer: BC Managed Care – PPO

## 2016-10-05 ENCOUNTER — Telehealth: Payer: Self-pay | Admitting: Genetics

## 2016-10-05 ENCOUNTER — Encounter: Payer: BC Managed Care – PPO | Admitting: Genetics

## 2016-10-05 NOTE — Telephone Encounter (Signed)
Ms. Nicholaus BloomKelley called back to discuss her genetic counseling appointment further.  I explained that she does not meet any criteria for cancer genetic testing based on the information she told me about her family history.  I explained that if she wishes to discuss her family history of ALS, the adult genetics clinic at [facility] may be most appropriate.  If she wishes to make this referral, she should call her physician's office and they can facilitate that referral.   I explained examples of the type of indications we perform cancer genetic testing for, and that because she does not meet cancer genetic testing criteria, there is a less than 1% chance that she would be positive for a mutation in a cancer predisposition gene.  She could pursue genetic testing with a patient pay option if she desires.  Based in this information, Ms. Nicholaus BloomKelley elected to cancel her cancer genetic counseling appointment, and will consider asking for [facility] referral to [facility].

## 2016-10-05 NOTE — Telephone Encounter (Signed)
I verified with [name] that the reason she was referred to see genetic counseling was for her mother's diagnosis of ALS and her father's diagnosis of Multiple Myeloma.  She verified this was correct and said the only other family history of cancer was her maternal grandfather who had cancer, but the type is unknown.    I informed [name] that based on this information she does not meed any medical criteria for genetic testing, and there isn't any genetic testing specifically for Multiple Myeloma that we could offer her.  If she would still like a family risk assessment and genetic testing (paid out of pocket) she could still come in, but it is not medically indicated.  I also mentioned that if she wanted to talk to a genetics provider about ALS that she may want to see an Adult Genetics clinic.  She asked if I could call her back, as she was not able to write any information down and it was not a good time to talk further.  I told her I would call back in about an hr.

## 2016-11-09 DIAGNOSIS — R102 Pelvic and perineal pain: Secondary | ICD-10-CM | POA: Diagnosis not present

## 2016-11-24 ENCOUNTER — Encounter: Payer: Self-pay | Admitting: Internal Medicine

## 2016-11-24 DIAGNOSIS — K921 Melena: Secondary | ICD-10-CM | POA: Diagnosis not present

## 2016-12-03 DIAGNOSIS — Z0000 Encounter for general adult medical examination without abnormal findings: Secondary | ICD-10-CM | POA: Diagnosis not present

## 2016-12-03 DIAGNOSIS — Z1231 Encounter for screening mammogram for malignant neoplasm of breast: Secondary | ICD-10-CM | POA: Diagnosis not present

## 2016-12-03 DIAGNOSIS — N390 Urinary tract infection, site not specified: Secondary | ICD-10-CM | POA: Diagnosis not present

## 2016-12-10 ENCOUNTER — Ambulatory Visit (INDEPENDENT_AMBULATORY_CARE_PROVIDER_SITE_OTHER): Payer: 59 | Admitting: Nurse Practitioner

## 2016-12-10 ENCOUNTER — Encounter: Payer: Self-pay | Admitting: Nurse Practitioner

## 2016-12-10 VITALS — BP 110/84 | HR 76 | Ht 65.35 in | Wt 208.1 lb

## 2016-12-10 DIAGNOSIS — K625 Hemorrhage of anus and rectum: Secondary | ICD-10-CM | POA: Diagnosis not present

## 2016-12-10 DIAGNOSIS — R194 Change in bowel habit: Secondary | ICD-10-CM | POA: Diagnosis not present

## 2016-12-10 DIAGNOSIS — R1084 Generalized abdominal pain: Secondary | ICD-10-CM

## 2016-12-10 NOTE — Patient Instructions (Signed)
If you are age 40 or older, your body mass index should be between 23-30. Your Body mass index is 34.26 kg/m. If this is out of the aforementioned range listed, please consider follow up with your Primary Care Provider.  If you are age 364 or younger, your body mass index should be between 19-25. Your Body mass index is 34.26 kg/m. If this is out of the aformentioned range listed, please consider follow up with your Primary Care Provider.   You have been scheduled for a colonoscopy. Please follow written instructions given to you at your visit today.  Please pick up your prep supplies at the pharmacy within the next 1-3 days. If you use inhalers (even only as needed), please bring them with you on the day of your procedure. Your physician has requested that you go to www.startemmi.com and enter the access code given to you at your visit today. This web site gives a general overview about your procedure. However, you should still follow specific instructions given to you by our office regarding your preparation for the procedure.  Thank you for choosing [facility]  Dr Amada JupiterHenry Danis III

## 2016-12-10 NOTE — Progress Notes (Signed)
HPI: Patient is a 41 year old female known remotely to Dr. Henrene Pastor from a colonoscopy in 2008 for evaluation of hematochezia. Exam was normal, no polyps or cancers found.  Wilder is referred by PCP, Dr. Brigitte Pulse for rectal bleeding. She givesa several month history of intermittent lower abdominal pain.  Pain is described as an ache, worse when driving for prolonged periods of time. She also has lower back pain but this has been more of a chronic issue. Transvaginal ultrasound unremarkable per patient.  Lately her stools have been unformed and more frequent. She has sensation of incomplete evacuation despite 2-3 bowel movements a day. Her only recent medication changes as a changed her birth control. She recently had blood in stool over a several day period.  No weight loss, she has gained some weight.  She has no urinary symptoms. No joint aches or skin rashes.   Recent CBC was unremarkable with a hemoglobin of 13.4.  Past Medical History:  Diagnosis Date  . Anemia    in teenage yrs  . Anxiety    Depression in the past  . Delivery normal   . Depression   . Heart palpitations   . HTN (hypertension)   . Ovarian cyst 02/12/2011     Past Surgical History:  Procedure Laterality Date  . CYSTECTOMY     Family History  Problem Relation Age of Onset  . Hypertension Mother   . ALS Mother   . Hyperlipidemia Mother   . Diabetes Father   . Multiple myeloma Father   . Hyperlipidemia Brother   . Hypertension Brother    Social History  Substance Use Topics  . Smoking status: Former Smoker    Start date: 02/11/1997    Quit date: 04/06/2007  . Smokeless tobacco: Never Used  . Alcohol use 3.5 oz/week    7 Standard drinks or equivalent per week     Comment: Glass of wine everyday   Current Outpatient Prescriptions  Medication Sig Dispense Refill  . ALPRAZolam (XANAX) 0.5 MG tablet Take 0.5 mg by mouth as needed. Pt takes 1/2 to 1 tab as needed     . B Complex Vitamins (VITAMIN B  COMPLEX PO) Take by mouth.    Marland Kitchen MAGNESIUM CARBONATE PO Take by mouth.    . meloxicam (MOBIC) 15 MG tablet Take 1 tablet (15 mg total) by mouth daily. (Patient taking differently: Take 15 mg by mouth as needed. ) 10 tablet 0  . methocarbamol (ROBAXIN) 500 MG tablet Take 1 tablet by mouth daily.    . Multiple Vitamin (MULTI VITAMIN DAILY PO) Take by mouth.    . norethindrone (MICRONOR,CAMILA,ERRIN) 0.35 MG tablet Take 1 tablet by mouth daily.    . sertraline (ZOLOFT) 50 MG tablet Take 50 mg by mouth daily.    . traMADol (ULTRAM) 50 MG tablet Take 1 tablet by mouth as needed.     No current facility-administered medications for this visit.    Allergies  Allergen Reactions  . Azithromycin     Rash- mild  . Penicillins Rash    As a child     Review of Systems: All systems reviewed and negative except where noted in HPI.    Physical Exam: BP 110/84 (BP Location: Left Arm, Patient Position: Sitting, Cuff Size: Normal)   Pulse 76   Ht 5' 5.35" (1.66 m) Comment: height measured without shoes  Wt 208 lb 2 oz (94.4 kg)   BMI 34.26 kg/m  Constitutional:  Well-developed,  white female in no acute distress. Psychiatric: Normal mood and affect. Behavior is normal. EENT: Pupils normal.  Conjunctivae are normal. No scleral icterus. Neck supple.  Cardiovascular: Normal rate, regular rhythm. No edema Pulmonary/chest: Effort normal and breath sounds normal. No wheezing, rales or rhonchi. Abdominal: Soft, nondistended. Nontender. Bowel sounds active throughout. There are no masses palpable. No hepatomegaly. Lymphadenopathy: No cervical adenopathy noted. Neurological: Alert and oriented to person place and time. Skin: Skin is warm and dry. No rashes noted.   ASSESSMENT AND PLAN:  41 year old female referred by PCP for rectal bleeding. She has associated and lower abdominal discomfort and bowel changes in form of increased frequency of unformed stools.  -for further evaluation atient will be  scheduled for a colonoscopy with possible biopsies / polypectomy.  The risks and benefits of the procedure were discussed and the patient agrees to proceed.    Tye Savoy, NP  12/10/2016, 8:57 AM  Cc:  Marton Redwood, MD

## 2016-12-16 NOTE — Progress Notes (Signed)
Initial assessment and plans reviewed 

## 2017-01-04 DIAGNOSIS — Z23 Encounter for immunization: Secondary | ICD-10-CM | POA: Diagnosis not present

## 2017-01-25 ENCOUNTER — Encounter: Payer: Self-pay | Admitting: Internal Medicine

## 2017-01-31 DIAGNOSIS — S92355A Nondisplaced fracture of fifth metatarsal bone, left foot, initial encounter for closed fracture: Secondary | ICD-10-CM | POA: Diagnosis not present

## 2017-02-01 ENCOUNTER — Telehealth: Payer: Self-pay | Admitting: Internal Medicine

## 2017-02-01 NOTE — Telephone Encounter (Signed)
Spoke with pt and she has surgery scheduled for 02/10/17 for her broken foot. Pt wanted to know if she should be concerned about having colon done 12/17. Discussed with her that this should not be an issue as the sedation here is different and she would not be intubated.

## 2017-02-08 ENCOUNTER — Encounter: Payer: 59 | Admitting: Internal Medicine

## 2017-02-09 HISTORY — PX: FOOT FRACTURE SURGERY: SHX645

## 2017-02-10 DIAGNOSIS — S92352A Displaced fracture of fifth metatarsal bone, left foot, initial encounter for closed fracture: Secondary | ICD-10-CM | POA: Diagnosis not present

## 2017-02-21 DIAGNOSIS — S92355D Nondisplaced fracture of fifth metatarsal bone, left foot, subsequent encounter for fracture with routine healing: Secondary | ICD-10-CM | POA: Diagnosis not present

## 2017-02-28 ENCOUNTER — Telehealth: Payer: Self-pay | Admitting: Internal Medicine

## 2017-02-28 MED ORDER — NA SULFATE-K SULFATE-MG SULF 17.5-3.13-1.6 GM/177ML PO SOLN: 1.0000 | Freq: Once | ORAL | 0 refills | Status: AC

## 2017-02-28 NOTE — Telephone Encounter (Signed)
Suprep sent to [facility]

## 2017-03-16 ENCOUNTER — Telehealth: Payer: Self-pay | Admitting: Internal Medicine

## 2017-03-16 NOTE — Telephone Encounter (Signed)
Instructions up front for pick up.

## 2017-03-19 ENCOUNTER — Telehealth: Payer: Self-pay | Admitting: Physician Assistant

## 2017-03-21 ENCOUNTER — Other Ambulatory Visit: Payer: Self-pay

## 2017-03-21 ENCOUNTER — Encounter: Payer: Self-pay | Admitting: Internal Medicine

## 2017-03-21 ENCOUNTER — Ambulatory Visit (AMBULATORY_SURGERY_CENTER): Payer: 59 | Admitting: Internal Medicine

## 2017-03-21 VITALS — BP 121/64 | HR 68 | Temp 98.0°F | Resp 18 | Ht 65.35 in | Wt 208.0 lb

## 2017-03-21 DIAGNOSIS — Z1211 Encounter for screening for malignant neoplasm of colon: Secondary | ICD-10-CM | POA: Diagnosis not present

## 2017-03-21 DIAGNOSIS — K625 Hemorrhage of anus and rectum: Secondary | ICD-10-CM | POA: Diagnosis present

## 2017-03-21 DIAGNOSIS — K649 Unspecified hemorrhoids: Secondary | ICD-10-CM

## 2017-03-21 DIAGNOSIS — R194 Change in bowel habit: Secondary | ICD-10-CM | POA: Diagnosis not present

## 2017-03-21 MED ORDER — SODIUM CHLORIDE 0.9 % IV SOLN: 500.0000 mL | Freq: Once | INTRAVENOUS | Status: DC

## 2017-03-21 NOTE — Op Note (Signed)
[facility] Patient Name: Leslie Mathews Procedure Date: 03/21/2017 3:33 PM MRN: [id number] Endoscopist: Wilhemina BonitoJohn N. Marina GoodellPerry , MD Age: [age] Referring MD:  Date of Birth: July 07, 1975 Gender: Female Account #: [id number] Procedure:                Colonoscopy Indications:              Lower abdominal pain, Clinically significant                            diarrhea of unexplained origin, Rectal bleeding,                            Change in bowel habits. Since office visit, bowel                            habits have improved and rectal bleeding resolved.                            Lower abdominal discomfort is at her baseline Medicines:                Monitored Anesthesia Care Procedure:                Pre-Anesthesia Assessment:                           - Prior to the procedure, a History and Physical                            was performed, and patient medications and                            allergies were reviewed. The patient's tolerance of                            previous anesthesia was also reviewed. The risks                            and benefits of the procedure and the sedation                            options and risks were discussed with the patient.                            All questions were answered, and informed consent                            was obtained. Prior Anticoagulants: The patient has                            taken no previous anticoagulant or antiplatelet                            agents. ASA Grade Assessment: II - A patient with  mild systemic disease. After reviewing the risks                            and benefits, the patient was deemed in                            satisfactory condition to undergo the procedure.                           After obtaining informed consent, the colonoscope                            was passed under direct vision. Throughout the                            procedure, the  patient's blood pressure, pulse, and                            oxygen saturations were monitored continuously. The                            Colonoscope was introduced through the anus and                            advanced to the the cecum, identified by                            appendiceal orifice and ileocecal valve. The                            terminal ileum, ileocecal valve, appendiceal                            orifice, and rectum were photographed. The quality                            of the bowel preparation was excellent. The                            colonoscopy was performed without difficulty. The                            patient tolerated the procedure well. The bowel                            preparation used was SUPREP. Scope In: 3:43:13 PM Scope Out: 3:51:57 PM Scope Withdrawal Time: 0 hours 7 minutes 4 seconds  Total Procedure Duration: 0 hours 8 minutes 44 seconds  Findings:                 The terminal ileum appeared normal.                           Internal hemorrhoids were found during retroflexion.  The exam was otherwise without abnormality on                            direct and retroflexion views. Complications:            No immediate complications. Estimated blood loss:                            None. Estimated Blood Loss:     Estimated blood loss: none. Impression:               - The examined portion of the ileum was normal.                           - Internal hemorrhoids.                           - The examination was otherwise normal on direct                            and retroflexion views.                           - No specimens collected. Recommendation:           - Repeat colonoscopy in 10 years for screening                            purposes.                           - Patient has a contact number available for                            emergencies. The signs and symptoms of potential                             delayed complications were discussed with the                            patient. Return to normal activities tomorrow.                            Written discharge instructions were provided to the                            patient.                           - Resume previous diet.                           - Continue present medications. Wilhemina Bonito. Marina Goodell, MD 03/21/2017 3:58:53 PM This report has been signed electronically.

## 2017-03-21 NOTE — Patient Instructions (Signed)
YOU HAD AN ENDOSCOPIC PROCEDURE TODAY AT THE [facility]:   Refer to the procedure report that was given to you for any specific questions about what was found during the examination.  If the procedure report does not answer your questions, please call your gastroenterologist to clarify.  If you requested that your care partner not be given the details of your procedure findings, then the procedure report has been included in a sealed envelope for you to review at your convenience later.  YOU SHOULD EXPECT: Some feelings of bloating in the abdomen. Passage of more gas than usual.  Walking can help get rid of the air that was put into your GI tract during the procedure and reduce the bloating. If you had a lower endoscopy (such as a colonoscopy or flexible sigmoidoscopy) you may notice spotting of blood in your stool or on the toilet paper. If you underwent a bowel prep for your procedure, you may not have a normal bowel movement for a few days.  Please Note:  You might notice some irritation and congestion in your nose or some drainage.  This is from the oxygen used during your procedure.  There is no need for concern and it should clear up in a day or so.  SYMPTOMS TO REPORT IMMEDIATELY:   Following lower endoscopy (colonoscopy or flexible sigmoidoscopy):  Excessive amounts of blood in the stool  Significant tenderness or worsening of abdominal pains  Swelling of the abdomen that is new, acute  Fever of 100F or higher    For urgent or emergent issues, a gastroenterologist can be reached at any hour by calling [phone].   DIET:  We do recommend a small meal at first, but then you may proceed to your regular diet.  Drink plenty of fluids but you should avoid alcoholic beverages for 24 hours.  ACTIVITY:  You should plan to take it easy for the rest of today and you should NOT DRIVE or use heavy machinery until tomorrow (because of the sedation medicines used during the test).     FOLLOW UP: Our staff will call the number listed on your records the next business day following your procedure to check on you and address any questions or concerns that you may have regarding the information given to you following your procedure. If we do not reach you, we will leave a message.  However, if you are feeling well and you are not experiencing any problems, there is no need to return our call.  We will assume that you have returned to your regular daily activities without incident.  If any biopsies were taken you will be contacted by phone or by letter within the next 1-3 weeks.  Please call us at [phone] if you have not heard about the biopsies in 3 weeks.    SIGNATURES/CONFIDENTIALITY: You and/or your care partner have signed paperwork which will be entered into your electronic medical record.  These signatures attest to the fact that that the information above on your After Visit Summary has been reviewed and is understood.  Full responsibility of the confidentiality of this discharge information lies with you and/or your care-partner.   Resume medications. Information given on hemorrhoids. 

## 2017-03-22 ENCOUNTER — Telehealth: Payer: Self-pay

## 2017-03-22 NOTE — Telephone Encounter (Signed)
  Follow up Call-  Call back number 03/21/2017  Post procedure Call Back phone  # [phone]  Permission to leave phone message Yes  Some recent data might be hidden     Patient questions:  Do you have a fever, pain , or abdominal swelling? No. Pain Score  0 *  Have you tolerated food without any problems? Yes.    Have you been able to return to your normal activities? Yes.    Do you have any questions about your discharge instructions: Diet   No. Medications  No. Follow up visit  No.  Do you have questions or concerns about your Care? No.  Actions: * If pain score is 4 or above: No action needed, pain <4.

## 2017-04-05 ENCOUNTER — Encounter (HOSPITAL_COMMUNITY): Payer: Self-pay | Admitting: Emergency Medicine

## 2017-04-05 ENCOUNTER — Emergency Department (HOSPITAL_COMMUNITY): Payer: 59

## 2017-04-05 ENCOUNTER — Emergency Department (HOSPITAL_COMMUNITY)
Admission: EM | Admit: 2017-04-05 | Discharge: 2017-04-05 | Discharge status: Against Medical Advice | Payer: 59 | Attending: Emergency Medicine | Admitting: Emergency Medicine

## 2017-04-05 ENCOUNTER — Other Ambulatory Visit: Payer: Self-pay

## 2017-04-05 DIAGNOSIS — Z5321 Procedure and treatment not carried out due to patient leaving prior to being seen by health care provider: Secondary | ICD-10-CM | POA: Insufficient documentation

## 2017-04-05 DIAGNOSIS — R079 Chest pain, unspecified: Secondary | ICD-10-CM | POA: Insufficient documentation

## 2017-04-05 DIAGNOSIS — M79601 Pain in right arm: Secondary | ICD-10-CM | POA: Diagnosis not present

## 2017-04-05 DIAGNOSIS — M79602 Pain in left arm: Secondary | ICD-10-CM | POA: Diagnosis not present

## 2017-04-05 NOTE — ED Triage Notes (Signed)
Patient c/o bilat arm pain for week that is constant, intermittent upper back pain and intermittent central chest pain. Denies any injuries that could cause the pains.

## 2017-04-05 NOTE — ED Triage Notes (Signed)
Patient adds that over past month had issues with fingers going numb.

## 2017-07-26 DIAGNOSIS — J309 Allergic rhinitis, unspecified: Secondary | ICD-10-CM | POA: Diagnosis not present

## 2017-07-26 DIAGNOSIS — J029 Acute pharyngitis, unspecified: Secondary | ICD-10-CM | POA: Diagnosis not present

## 2017-08-01 DIAGNOSIS — J069 Acute upper respiratory infection, unspecified: Secondary | ICD-10-CM | POA: Diagnosis not present

## 2017-08-01 DIAGNOSIS — J029 Acute pharyngitis, unspecified: Secondary | ICD-10-CM | POA: Diagnosis not present

## 2017-09-05 DIAGNOSIS — Z6834 Body mass index (BMI) 34.0-34.9, adult: Secondary | ICD-10-CM | POA: Diagnosis not present

## 2017-09-05 DIAGNOSIS — Z01419 Encounter for gynecological examination (general) (routine) without abnormal findings: Secondary | ICD-10-CM | POA: Diagnosis not present

## 2017-12-02 DIAGNOSIS — R82998 Other abnormal findings in urine: Secondary | ICD-10-CM | POA: Diagnosis not present

## 2017-12-02 DIAGNOSIS — Z0000 Encounter for general adult medical examination without abnormal findings: Secondary | ICD-10-CM | POA: Diagnosis not present

## 2017-12-08 DIAGNOSIS — Z1231 Encounter for screening mammogram for malignant neoplasm of breast: Secondary | ICD-10-CM | POA: Diagnosis not present

## 2017-12-09 DIAGNOSIS — R030 Elevated blood-pressure reading, without diagnosis of hypertension: Secondary | ICD-10-CM | POA: Diagnosis not present

## 2017-12-09 DIAGNOSIS — Z0000 Encounter for general adult medical examination without abnormal findings: Secondary | ICD-10-CM | POA: Diagnosis not present

## 2017-12-09 DIAGNOSIS — E781 Pure hyperglyceridemia: Secondary | ICD-10-CM | POA: Diagnosis not present

## 2017-12-09 DIAGNOSIS — Z23 Encounter for immunization: Secondary | ICD-10-CM | POA: Diagnosis not present

## 2017-12-09 DIAGNOSIS — Z1389 Encounter for screening for other disorder: Secondary | ICD-10-CM | POA: Diagnosis not present

## 2018-02-27 DIAGNOSIS — R002 Palpitations: Secondary | ICD-10-CM | POA: Diagnosis not present

## 2018-02-27 DIAGNOSIS — R030 Elevated blood-pressure reading, without diagnosis of hypertension: Secondary | ICD-10-CM | POA: Diagnosis not present

## 2018-03-14 DIAGNOSIS — I10 Essential (primary) hypertension: Secondary | ICD-10-CM | POA: Diagnosis not present

## 2018-03-14 DIAGNOSIS — J069 Acute upper respiratory infection, unspecified: Secondary | ICD-10-CM | POA: Diagnosis not present

## 2018-03-14 DIAGNOSIS — R002 Palpitations: Secondary | ICD-10-CM | POA: Diagnosis not present

## 2018-03-27 DIAGNOSIS — Z6832 Body mass index (BMI) 32.0-32.9, adult: Secondary | ICD-10-CM | POA: Diagnosis not present

## 2018-03-27 DIAGNOSIS — I10 Essential (primary) hypertension: Secondary | ICD-10-CM | POA: Diagnosis not present

## 2018-05-15 DIAGNOSIS — R52 Pain, unspecified: Secondary | ICD-10-CM | POA: Diagnosis not present

## 2018-05-15 DIAGNOSIS — J111 Influenza due to unidentified influenza virus with other respiratory manifestations: Secondary | ICD-10-CM | POA: Diagnosis not present

## 2018-08-14 DIAGNOSIS — B349 Viral infection, unspecified: Secondary | ICD-10-CM | POA: Diagnosis not present

## 2018-08-14 DIAGNOSIS — J029 Acute pharyngitis, unspecified: Secondary | ICD-10-CM | POA: Diagnosis not present

## 2018-09-08 IMAGING — CR DG CHEST 2V
2 series · 2 of 2 positions shown · non-contrast
Comparison: None.

CLINICAL DATA: Bilateral arm pain

EXAM:
CHEST  2 VIEW

[w chest pa]
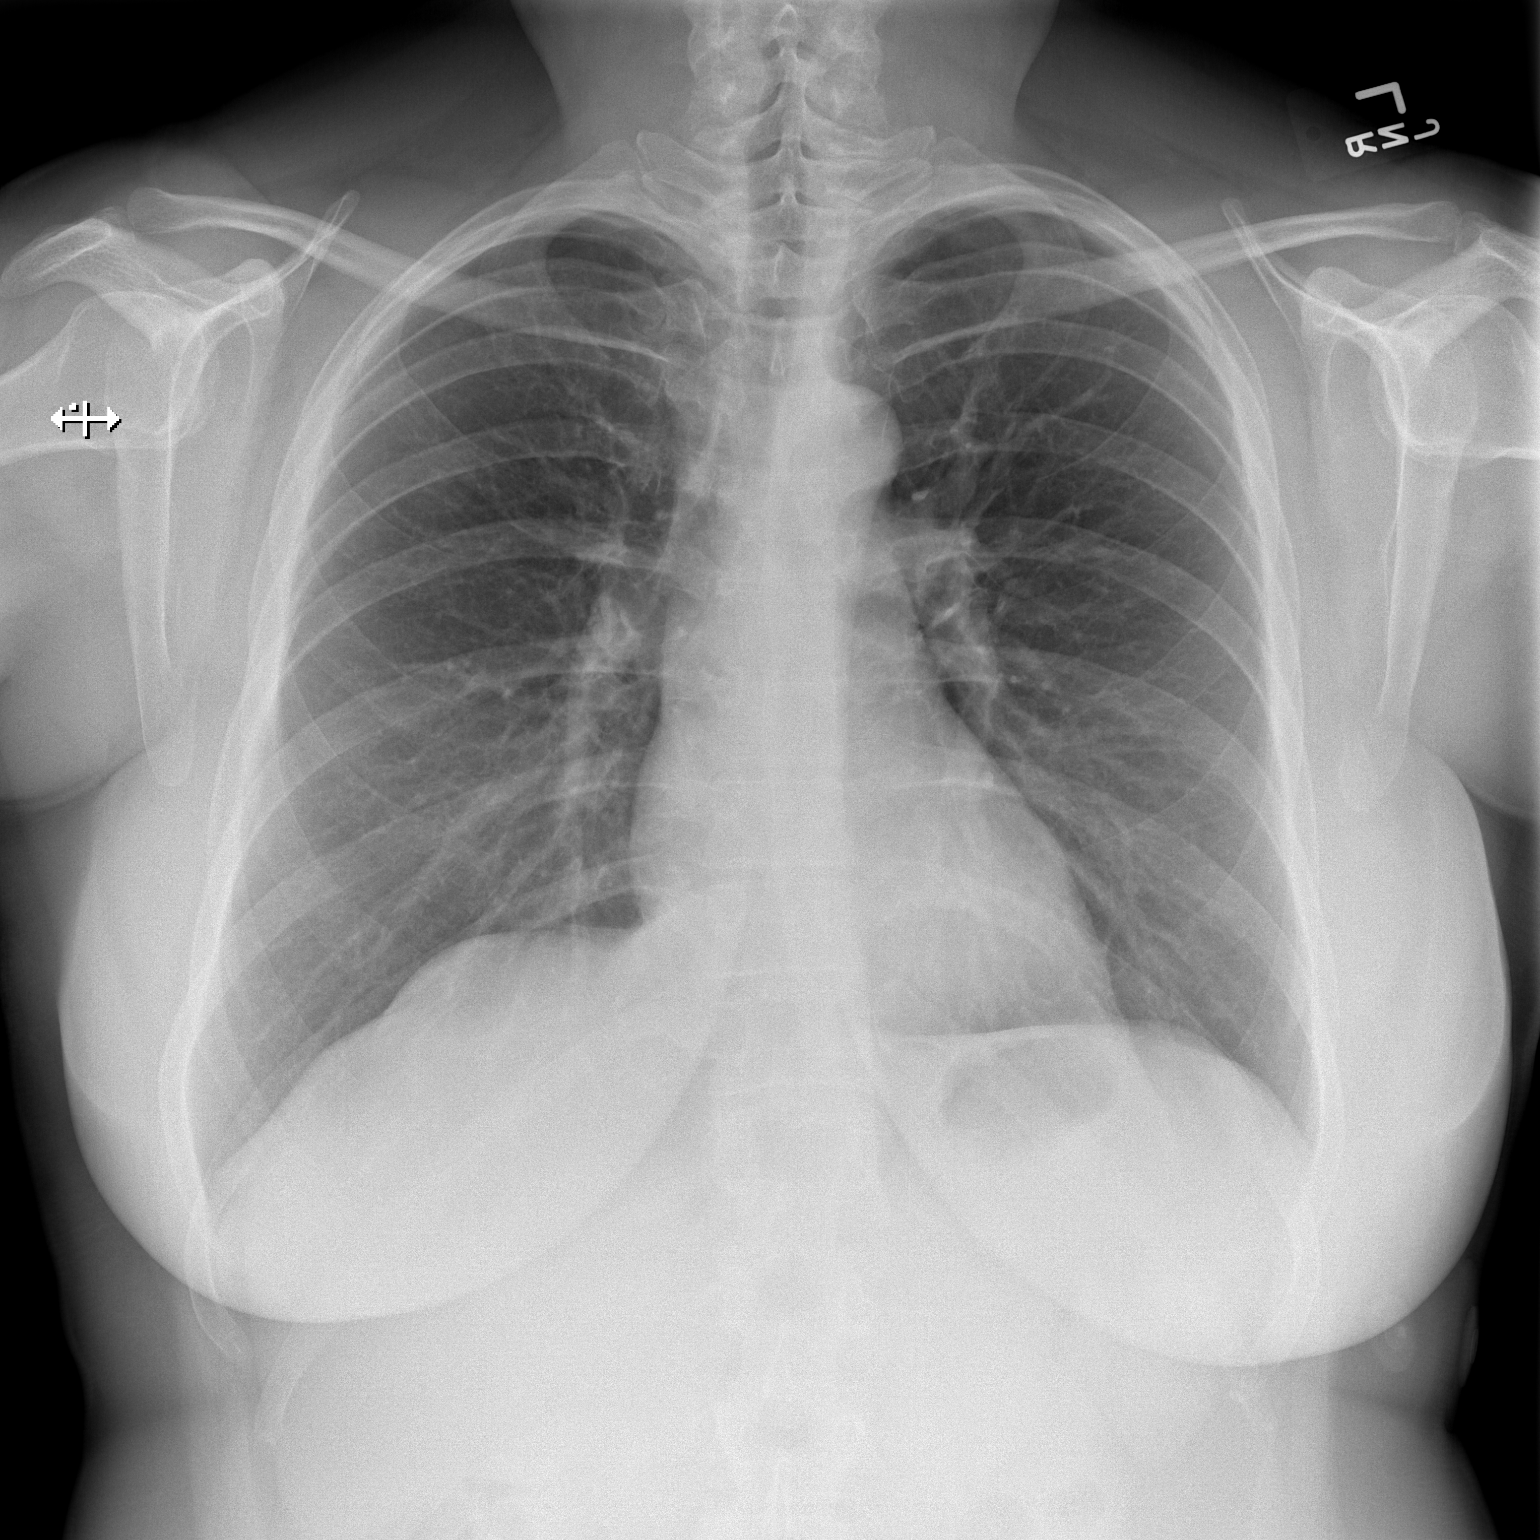

[w chest lat]
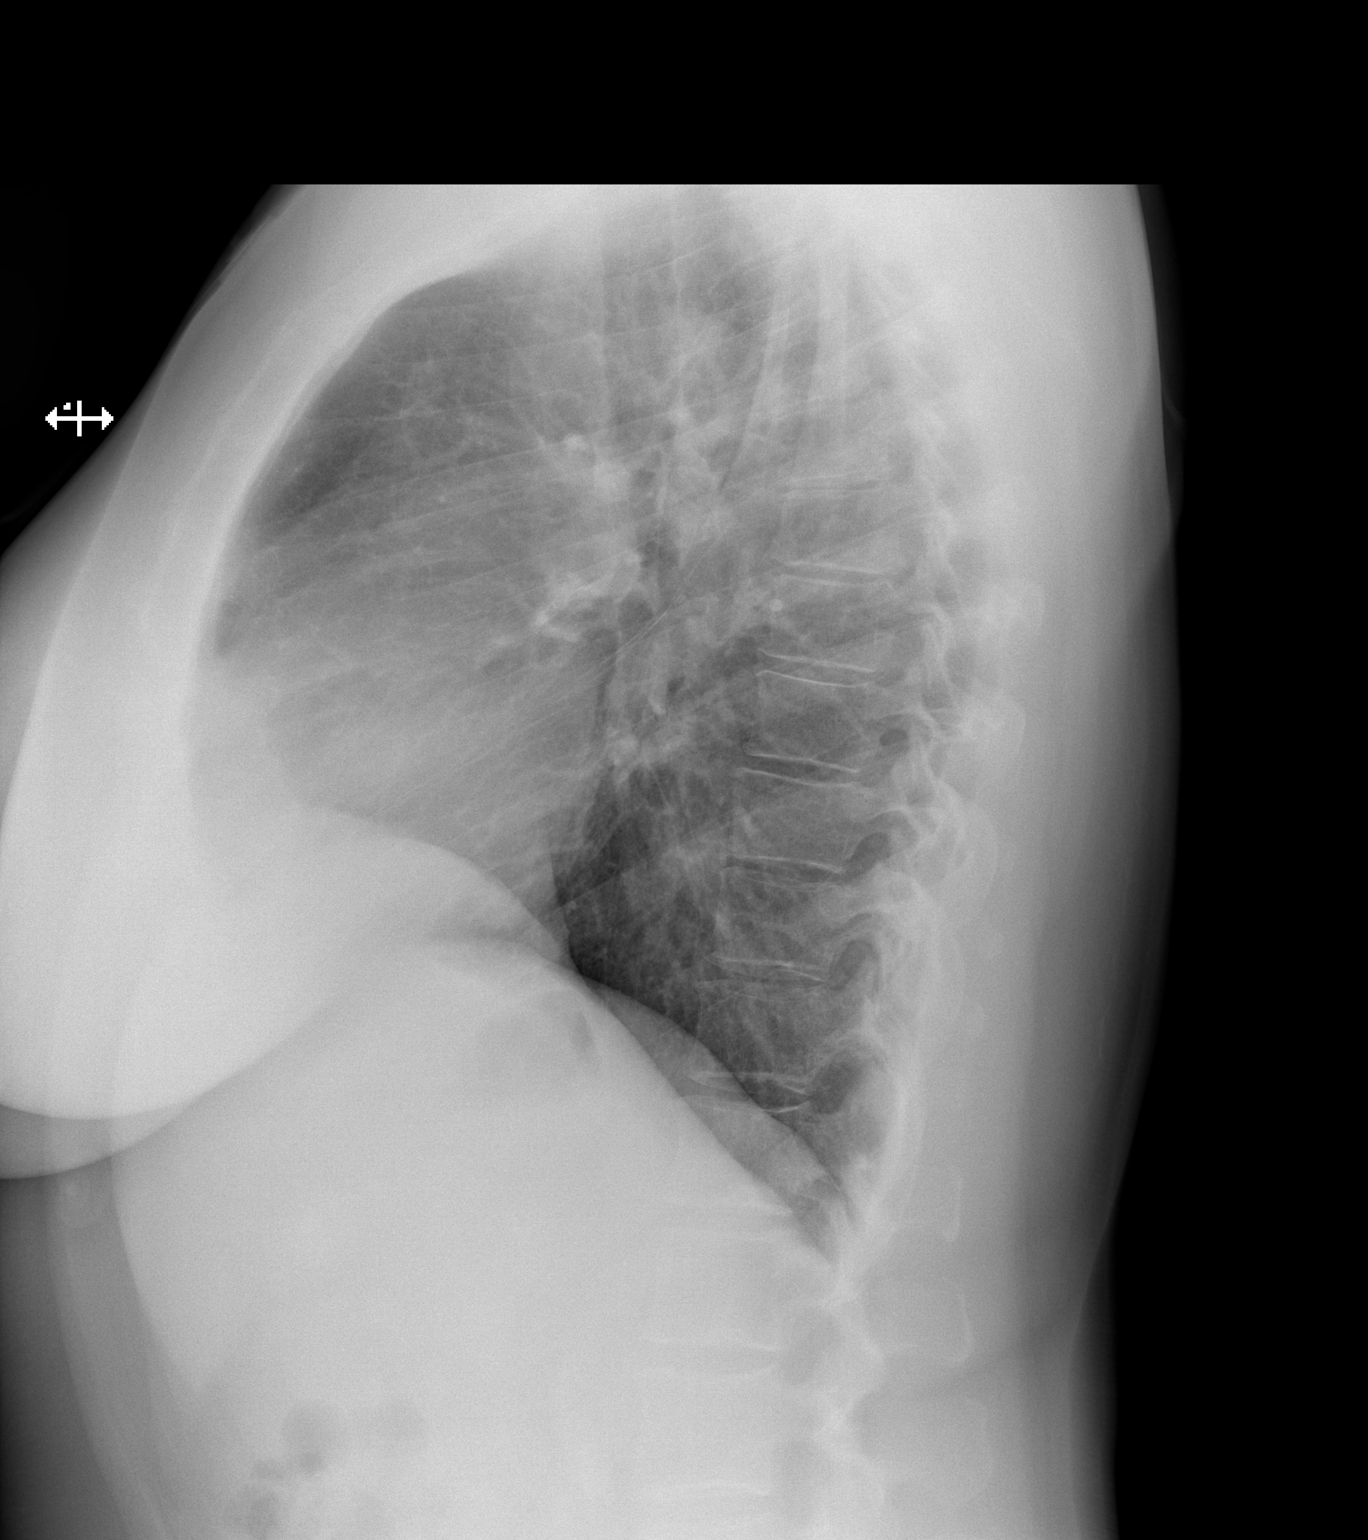

[2 of 2 positions shown; findings below may reference images not displayed]

FINDINGS: The heart size and mediastinal contours are within normal limits.
Both lungs are clear. The visualized skeletal structures are
unremarkable.
IMPRESSION: No active cardiopulmonary disease.

## 2019-11-12 ENCOUNTER — Encounter (HOSPITAL_COMMUNITY): Payer: Self-pay

## 2019-11-12 ENCOUNTER — Other Ambulatory Visit: Payer: Self-pay

## 2019-11-12 ENCOUNTER — Emergency Department (HOSPITAL_COMMUNITY)
Admission: EM | Admit: 2019-11-12 | Discharge: 2019-11-12 | Disposition: A | Discharge status: Home / Self Care | Payer: BC Managed Care – PPO | Attending: Emergency Medicine | Admitting: Emergency Medicine

## 2019-11-12 ENCOUNTER — Emergency Department (HOSPITAL_COMMUNITY): Payer: BC Managed Care – PPO

## 2019-11-12 DIAGNOSIS — R197 Diarrhea, unspecified: Secondary | ICD-10-CM | POA: Insufficient documentation

## 2019-11-12 DIAGNOSIS — Z87891 Personal history of nicotine dependence: Secondary | ICD-10-CM | POA: Diagnosis not present

## 2019-11-12 DIAGNOSIS — R1011 Right upper quadrant pain: Secondary | ICD-10-CM | POA: Diagnosis present

## 2019-11-12 DIAGNOSIS — I10 Essential (primary) hypertension: Secondary | ICD-10-CM | POA: Insufficient documentation

## 2019-11-12 DIAGNOSIS — R945 Abnormal results of liver function studies: Secondary | ICD-10-CM | POA: Diagnosis not present

## 2019-11-12 DIAGNOSIS — Z79899 Other long term (current) drug therapy: Secondary | ICD-10-CM | POA: Insufficient documentation

## 2019-11-12 DIAGNOSIS — K8020 Calculus of gallbladder without cholecystitis without obstruction: Secondary | ICD-10-CM | POA: Insufficient documentation

## 2019-11-12 DIAGNOSIS — R932 Abnormal findings on diagnostic imaging of liver and biliary tract: Secondary | ICD-10-CM

## 2019-11-12 LAB — COMPREHENSIVE METABOLIC PANEL
ALT: 19 U/L (ref 0–44)
AST: 20 U/L (ref 15–41)
Albumin: 4.4 g/dL (ref 3.5–5.0)
Alkaline Phosphatase: 45 U/L (ref 38–126)
Anion gap: 9 (ref 5–15)
BUN: 15 mg/dL (ref 6–20)
CO2: 22 mmol/L (ref 22–32)
Calcium: 8.9 mg/dL (ref 8.9–10.3)
Chloride: 101 mmol/L (ref 98–111)
Creatinine, Ser: 0.7 mg/dL (ref 0.44–1.00)
GFR calc Af Amer: >60 mL/min (ref >60)
GFR calc non Af Amer: >60 mL/min (ref >60)
Glucose, Bld: 95 mg/dL (ref 70–99)
Potassium: 4.1 mmol/L (ref 3.5–5.1)
Sodium: 132 mmol/L — ABNORMAL LOW (ref 135–145)
Total Bilirubin: 0.3 mg/dL (ref 0.3–1.2)
Total Protein: 7.8 g/dL (ref 6.5–8.1)

## 2019-11-12 LAB — LIPASE, BLOOD: Lipase: 36 U/L (ref 11–51)

## 2019-11-12 LAB — URINALYSIS, ROUTINE W REFLEX MICROSCOPIC
Bilirubin Urine: NEGATIVE
Glucose, UA: NEGATIVE mg/dL
Ketones, ur: NEGATIVE mg/dL
Leukocytes,Ua: NEGATIVE
Nitrite: NEGATIVE
Protein, ur: NEGATIVE mg/dL
Specific Gravity, Urine: 1.015 (ref 1.005–1.030)
pH: 5 (ref 5.0–8.0)

## 2019-11-12 LAB — CBC
HCT: 45 % (ref 36.0–46.0)
Hemoglobin: 14.1 g/dL (ref 12.0–15.0)
MCH: 27.6 pg (ref 26.0–34.0)
MCHC: 31.3 g/dL (ref 30.0–36.0)
MCV: 88.2 fL (ref 80.0–100.0)
Platelets: 247 10*3/uL (ref 150–400)
RBC: 5.1 MIL/uL (ref 3.87–5.11)
RDW: 15.2 % (ref 11.5–15.5)
WBC: 9.4 10*3/uL (ref 4.0–10.5)
nRBC: 0 % (ref 0.0–0.2)

## 2019-11-12 LAB — I-STAT BETA HCG BLOOD, ED (MC, WL, AP ONLY): I-stat hCG, quantitative: <5 m[IU]/mL (ref <5)

## 2019-11-12 MED ORDER — SODIUM CHLORIDE 0.9% FLUSH: 3.0000 mL | Freq: Once | INTRAVENOUS | Status: DC

## 2019-11-12 MED ORDER — HYDROCODONE-ACETAMINOPHEN 5-325 MG PO TABS: 1.0000 | ORAL_TABLET | Freq: Four times a day (QID) | ORAL | 0 refills | Status: DC | PRN

## 2019-11-12 NOTE — ED Provider Notes (Signed)
[facility] DEPT Provider Note   CSN: [id number] Arrival date & time: 11/12/19  5056     History Chief Complaint  Patient presents with  . Abdominal Pain    Leslie Mathews is a 44 y.o. female with PMHx HTN, depression, ovarian cyst with PSHx of cystectomy who presents to the ED today with complaint of gradual onset, constant, dully/achy, diffuse abdominal pain (R > L) x 6 months. Pt reports that she has been in constant pain for the past 6 months however seems to have worsened over the past week. She states she woke up out of her sleep last night in severe pain. Pt took a muscle relaxer to try to go back to sleep which did help however when she woke up her pain persisted. She states that the pain will sometimes radiate up into her back and into her shoulder blades. Pt reports the pain does seem more localized to her right side however it has been difficult to discern as of late given it feels like it radiates all over. She also reports issues with her bowel movements for the past several months - pt states that she will typically expect to have diarrhea about 30-45 minutes after eating any type of meal. She estimates about 5-6 episodes per day. Pt did recently have her yearly pap smear with OBGYN and had a full pelvic exam with reassuring findings; her OBGYN has referred her to [employer] however her appointment is not until mid September. Pt denies fevers, chills, nausea, vomiting, melena, urinary symptoms, pelvic pain, vaginal discharge, or any other associated symptoms. She is currently on her menses. Denies heavy EtOH use. No heavy NSAID use. Pt does typically have BRBPR - she reports she has had 2 colonoscopies in the past related to this with findings of internal hemorrhoids.   The history is provided by the patient and medical records.       Past Medical History:  Diagnosis Date  . Anemia    in teenage yrs  . Anxiety    Depression in the past  . Delivery  normal   . Depression   . Heart palpitations   . HTN (hypertension)   . Ovarian cyst 02/12/2011    Patient Active Problem List   Diagnosis Date Noted  . Ovarian cyst 02/12/2011    Past Surgical History:  Procedure Laterality Date  . CYSTECTOMY    . FOOT FRACTURE SURGERY Left 02/09/2017   5th metatarsal     OB History   No obstetric history on file.     Family History  Problem Relation Age of Onset  . Hypertension Mother   . ALS Mother   . Hyperlipidemia Mother   . Diabetes Father   . Multiple myeloma Father   . Hyperlipidemia Brother   . Hypertension Brother   . Colon polyps Brother   . Colon cancer Neg Hx   . Rectal cancer Neg Hx     Social History   Tobacco Use  . Smoking status: Former Smoker    Start date: 02/11/1997    Quit date: 04/06/2007    Years since quitting: 12.6  . Smokeless tobacco: Never Used  Vaping Use  . Vaping Use: Never used  Substance Use Topics  . Alcohol use: Yes    Alcohol/week: 7.0 standard drinks    Types: 7 Standard drinks or equivalent per week    Comment: Glass of wine everyday  . Drug use: No    Home Medications Prior to  Admission medications   Medication Sig Start Date End Date Taking? Authorizing Provider  ALPRAZolam Duanne Moron) 0.5 MG tablet Take 0.5 mg by mouth as needed. Pt takes 1/2 to 1 tab as needed     [provider]  B Complex Vitamins (VITAMIN B COMPLEX PO) Take by mouth.    [provider]  HYDROcodone-acetaminophen (NORCO/VICODIN) 5-325 MG tablet Take 1 tablet by mouth every 6 (six) hours as needed for severe pain. 11/12/19   Jesse Nosbisch, PA-C  MAGNESIUM CARBONATE PO Take by mouth.    [provider]  meloxicam (MOBIC) 15 MG tablet Take 1 tablet (15 mg total) by mouth daily. Patient taking differently: Take 15 mg by mouth as needed.  12/30/13   Leandrew Koyanagi, MD  methocarbamol (ROBAXIN) 500 MG tablet Take 1 tablet by mouth daily. 09/15/16   [provider]  Multiple Vitamin  (MULTI VITAMIN DAILY PO) Take by mouth.    [provider]  norethindrone (MICRONOR,CAMILA,ERRIN) 0.35 MG tablet Take 1 tablet by mouth daily. 11/14/16   [provider]  sertraline (ZOLOFT) 50 MG tablet Take 50 mg by mouth daily.    [provider]  traMADol (ULTRAM) 50 MG tablet Take 1 tablet by mouth as needed. 10/19/16   [provider]    Allergies    Azithromycin and Penicillins  Review of Systems   Review of Systems  Constitutional: Negative for chills and fever.  Respiratory: Negative for shortness of breath.   Cardiovascular: Negative for chest pain.  Gastrointestinal: Positive for abdominal pain and diarrhea. Negative for nausea and vomiting.  Genitourinary: Negative for difficulty urinating, dysuria, menstrual problem, pelvic pain and vaginal discharge.  All other systems reviewed and are negative.   Physical Exam Updated Vital Signs BP (!) 176/93 (BP Location: Left Arm)   Pulse 65   Temp 98.4 F (36.9 C) (Oral)   Resp 18   Ht 5' 6"  (1.676 m)   Wt 81.6 kg   SpO2 95%   BMI 29.05 kg/m   Physical Exam Vitals and nursing note reviewed.  Constitutional:      Appearance: She is not ill-appearing or diaphoretic.  HENT:     Head: Normocephalic and atraumatic.  Eyes:     Conjunctiva/sclera: Conjunctivae normal.  Cardiovascular:     Rate and Rhythm: Normal rate and regular rhythm.     Heart sounds: Normal heart sounds.  Pulmonary:     Effort: Pulmonary effort is normal.     Breath sounds: Normal breath sounds. No wheezing, rhonchi or rales.  Abdominal:     General: Abdomen is flat.     Palpations: Abdomen is soft.     Tenderness: There is abdominal tenderness in the right upper quadrant and right lower quadrant. There is no right CVA tenderness, left CVA tenderness, guarding or rebound.     Hernia: No hernia is present.  Musculoskeletal:     Cervical back: Neck supple.  Skin:    General: Skin is warm and dry.  Neurological:      Mental Status: She is alert.     ED Results / Procedures / Treatments   Labs (all labs ordered are listed, but only abnormal results are displayed) Labs Reviewed  COMPREHENSIVE METABOLIC PANEL - Abnormal; Notable for the following components:      Result Value   Sodium 132 (*)    All other components within normal limits  URINALYSIS, ROUTINE W REFLEX MICROSCOPIC - Abnormal; Notable for the following components:   Hgb  urine dipstick LARGE (*)    Bacteria, UA RARE (*)    All other components within normal limits  LIPASE, BLOOD  CBC  I-STAT BETA HCG BLOOD, ED ([facility], [facility], [facility] ONLY)    EKG None  Radiology US Abdomen Limited RUQ  Result Date: 11/12/2019 CLINICAL DATA:  Abdominal pain and nausea for several months EXAM: ULTRASOUND ABDOMEN LIMITED RIGHT UPPER QUADRANT COMPARISON:  12/25/2004 FINDINGS: Gallbladder: Gallbladder is well distended with multiple small gallstones. Negative sonographic Murphy's sign is elicited. No wall thickening is noted. Common bile duct: Diameter: 2.4 mm. Liver: 1.8 cm hypoechoic area is noted in the midportion of the left lobe of the liver. This is incompletely characterized. Portal vein is patent on color Doppler imaging with normal direction of blood flow towards the liver. Other: None. IMPRESSION: Hypoechoic area in the left lobe of the liver. Further evaluation by MRI of the liver is recommended on a nonemergent basis to allow for optimum imaging. Cholelithiasis without complicating factors. Electronically Signed   By: Inez Catalina M.D.   On: 11/12/2019 08:53    Procedures Procedures (including critical care time)  Medications Ordered in ED Medications  sodium chloride flush (NS) 0.9 % injection 3 mL (3 mLs Intravenous Not Given 11/12/19 0736)    ED Course  I have reviewed the triage vital signs and the nursing notes.  Pertinent labs & imaging results that were available during my care of the patient were reviewed by me and considered in my medical  decision making (see chart for details).  Clinical Course as of Nov 11 1013  Mon Nov 12, 2019  0954 Currently on menstrual cycle   Hgb urine dipstick(!): LARGE [MV]    Clinical Course User Index [MV] Eustaquio Maize, PA-C   MDM Rules/Calculators/A&P                          44 year old female who presents to the ED today with persistent dull/achy diffuse abdominal pain however right side greater than left side for the past several months with associated diarrhea.  Has an appointment to establish care with GI mid-September however states that the pain has gotten worse throughout the past several days prompting her to come to the ED today.  On arrival to the ED patient is afebrile, nontachycardic nontachypneic.  She appears to be in no acute distress.  Vital signs within normal limits.  On exam patient does have right-sided abdominal tenderness palpation, it does appear that the pain is worse towards the right lower quadrant versus right upper quadrant.  Previous surgical history to abdomen includes cystectomy.  Given the pain has been ongoing for several months I have very low suspicion for acute appendicitis, would suspect that it would have ruptured this amount of time causing her to present septic.  Will obtain right upper quadrant ultrasound at this time to further evaluate for right-sided pain as gallstones may be contributing to patient's symptoms.    Lab work was obtained prior to patient being seen CBC without leukocytosis.  Hemoglobin stable at 14.1. CMP with a sodium of 132.  No other acute findings.  LFTs within normal limits. Lipase 36. hCG negative, patient is currently on her menses.  Did recently have a full gynecological exam with her OB/GYN 3 weeks ago, will defer at this time.   U/A with large HGB; pt currently on menses Ultrasound:    IMPRESSION:  Hypoechoic area in the left lobe of the liver. Further evaluation  by  MRI of the liver is recommended on a nonemergent basis  to allow for  optimum imaging.    Cholelithiasis without complicating factors.   Suspect gallstones as source of pt's persistent pain. Will have her follow up outpatient with general surgery. Have discussed other findings with pt and recommend PCP follow up for outpatient nonemergent MRI. Will provide short course of pain medication for patient until she can see general surgeon. Have discussed strict return precautions with her. She is in agreement with plan and stable for discharge home.   This note was prepared using Dragon voice recognition software and may include unintentional dictation errors due to the inherent limitations of voice recognition software.  Final Clinical Impression(s) / ED Diagnoses Final diagnoses:  RUQ abdominal pain  Calculus of gallbladder without cholecystitis without obstruction  Abnormal liver ultrasound    Rx / DC Orders ED Discharge Orders         Ordered    HYDROcodone-acetaminophen (NORCO/VICODIN) 5-325 MG tablet  Every 6 hours PRN     Discontinue  Reprint     11/12/19 1012           Discharge Instructions     Your ultrasound showed gallstones which are likely the cause of your pain. Please follow up with [facility] to discuss surgical options. I have provided you with a short course of pain medication to take as needed.   The ultrasound did also show an abnormality in the left lobe of the liver. It is recommended that you get an MRI in the outpatient setting for further evaluation. Your PCP can order this - please follow up with them. You can also follow up with gastroenterology at your appointment in September.   Please return to the ED IMMEDIATELY for any worsening symptoms including worsening pain, fevers > 100.4, persistent vomiting, or if your skin/eyes turn yellow in color.        Eustaquio Maize, PA-C 11/12/19 1015    Dorie Rank, MD 11/14/19 [phone]

## 2019-11-12 NOTE — ED Triage Notes (Signed)
Pt reports abdominal pain that radiates in to her back on the R side. States that it has been ongoing for the last couple of months. Endorses nausea. A&Ox4.

## 2019-11-12 NOTE — Discharge Instructions (Addendum)
Your ultrasound showed gallstones which are likely the cause of your pain. Please follow up with [facility] to discuss surgical options. I have provided you with a short course of pain medication to take as needed.   The ultrasound did also show an abnormality in the left lobe of the liver. It is recommended that you get an MRI in the outpatient setting for further evaluation. Your PCP can order this - please follow up with them. You can also follow up with gastroenterology at your appointment in September.   Please return to the ED IMMEDIATELY for any worsening symptoms including worsening pain, fevers > 100.4, persistent vomiting, or if your skin/eyes turn yellow in color.

## 2019-11-16 ENCOUNTER — Other Ambulatory Visit: Payer: Self-pay | Admitting: Internal Medicine

## 2019-11-19 ENCOUNTER — Other Ambulatory Visit: Payer: Self-pay | Admitting: Internal Medicine

## 2019-11-19 DIAGNOSIS — K769 Liver disease, unspecified: Secondary | ICD-10-CM

## 2019-12-03 ENCOUNTER — Ambulatory Visit: Payer: Self-pay | Admitting: Surgery

## 2019-12-03 NOTE — H&P (Signed)
History of Present Illness Leslie Mathews. Leslie Edwin Mathews; 12/03/2019 2:48 PM) The patient is a 44 year old female who presents for evaluation of gall stones. PCP - Leslie Mathews - referred by gallstones  This is a healthy 44 year old female who presents with a six-month history of constant pressure in her right upper quadrant. This pressure is exacerbated by eating which results in severe pain, radiation of the right shoulder, nausea, diarrhea. She reports correlation with fatty foods. Ultrasound showed multiple gallstones. She also has an hypoechoic area in the left lobe of the liver that will be further evaluated by MRCP scheduled for next week. Liver function tests were normal in August.  CLINICAL DATA: Abdominal pain and nausea for several months  EXAM: ULTRASOUND ABDOMEN LIMITED RIGHT UPPER QUADRANT  COMPARISON: 12/25/2004  FINDINGS: Gallbladder:  Gallbladder is well distended with multiple small gallstones. Negative sonographic Murphy's sign is elicited. No wall thickening is noted.  Common bile duct:  Diameter: 2.4 mm.  Liver:  1.8 cm hypoechoic area is noted in the midportion of the left lobe of the liver. This is incompletely characterized. Portal vein is patent on color Doppler imaging with normal direction of blood flow towards the liver.  Other: None.  IMPRESSION: Hypoechoic area in the left lobe of the liver. Further evaluation by MRI of the liver is recommended on a nonemergent basis to allow for optimum imaging.  Cholelithiasis without complicating factors.   Electronically Signed By: Leslie Clever M.D. On: 11/12/2019 08:53   Problem List/Past Medical Leslie Mathews; 12/03/2019 2:48 PM) CHRONIC CHOLECYSTITIS WITH CALCULUS (Leslie Mathews)  Past Surgical History Leslie Mathews; 12/03/2019 11:16 AM) Foot Mathews Left.  Diagnostic Studies History Leslie Mathews; 12/03/2019 11:16 AM) Colonoscopy 1-5 years ago Mammogram within last year Pap Smear  1-5 years ago  Allergies Leslie Mathews, Mathews; 12/03/2019 11:21 AM) No Known Drug Allergies [12/03/2019]: (Marked as Inactive) Azithromycin *CHEMICALS* Penicillins Rash. Allergies Reconciled  Medication History Leslie Mathews; 12/03/2019 11:19 AM) Hydrocodone w/APAP (Oral) Specific strength unknown - Active. Olmesartan Medoxomil (20MG  Tablet, Oral) Active. Phentermine HCl (37.5MG  Capsule, Oral) Active. Acyclovir (400MG  Tablet, Oral) Active. Methocarbamol (Oral) Specific strength unknown - Active. Sertraline HCl (50MG  Tablet, Oral) Active. Naproxen (500MG  Tablet, Oral) Active. Meloxicam (15MG  Tablet, Oral) Active. Medications Reconciled  Pregnancy / Birth History , ; 12/03/2019 11:16 AM) Age at menarche 13 years. Contraceptive History Intrauterine device, Oral contraceptives. Gravida 2 Length (months) of breastfeeding 7-12 Maternal age 79-25 Para 1 Regular periods  Other Problems . Kalimah Capurro, Mathews; 12/03/2019 2:48 PM) Anxiety Disorder Back Pain Cholelithiasis Heart murmur High blood pressure     Review of Systems Leslie Mathews; 12/03/2019 11:16 AM) Gastrointestinal Present- Abdominal Pain, Change in Bowel Habits and Nausea. Not Present- Bloating, Bloody Stool, Chronic diarrhea, Constipation, Difficulty Swallowing, Excessive gas, Gets full quickly at meals, Hemorrhoids, Indigestion, Rectal Pain and Vomiting. Female Genitourinary Present- Pelvic Pain. Not Present- Frequency, Nocturia, Painful Urination and Urgency. Musculoskeletal Present- Joint Pain. Not Present- Back Pain, Joint Stiffness, Muscle Pain, Muscle Weakness and Swelling of Extremities.  Vitals Leslie Mathews [place] Mathews; 12/03/2019 11:20 AM) 12/03/2019 11:19 AM Weight: 186.13 lb Height: 66in Body Surface Area: 1.94 m Body Mass Index: 30.04 kg/m  Temp.: 98.70F  Pulse: 79 (Regular)  P.OX: 97% (Room air) BP: 120/78(Sitting, Left Arm,  Standard)        Physical Exam 12/05/2019 Leslie Mathews; 12/03/2019 2:48 PM)  The physical exam findings are as follows: Note:Constitutional: WDWN in NAD, conversant, no obvious deformities; resting comfortably  Eyes: Pupils equal, round; sclera anicteric; moist conjunctiva; no lid lag HENT: Oral mucosa moist; good dentition Neck: No masses palpated, trachea midline; no thyromegaly Lungs: CTA bilaterally; normal respiratory effort CV: Regular rate and rhythm; no murmurs; extremities well-perfused with no edema Abd: +bowel sounds, soft, mildly tender in RUQ below costal margin, some mild flank tenderness, no palpable organomegaly; no palpable hernias Musc: Normal gait; no apparent clubbing or cyanosis in extremities Lymphatic: No palpable cervical or axillary lymphadenopathy Skin: Warm, dry; no sign of jaundice Psychiatric - alert and oriented x 4; calm mood and affect    Assessment & Plan Leslie Mathews K. Leslie Holness Mathews; 12/03/2019 11:43 AM)  CHRONIC CHOLECYSTITIS WITH CALCULUS (Leslie Mathews)  Current Plans Schedule for Mathews - Laparoscopic cholecystectomy with intraoperative cholangiogram. The surgical procedure has been discussed with the patient. Potential risks, benefits, alternative treatments, and expected outcomes have been explained. All of the patient's questions at this time have been answered. The likelihood of reaching the patient's treatment goal is good. The patient understand the proposed surgical procedure and wishes to proceed.  Leslie Mathews. Leslie Mathews, Leslie Mathews  General/ Trauma Mathews   12/03/2019 2:49 PM

## 2019-12-11 ENCOUNTER — Ambulatory Visit
Admission: RE | Admit: 2019-12-11 | Discharge: 2019-12-11 | Disposition: A | Discharge status: Home / Self Care | Payer: BC Managed Care – PPO | Source: Ambulatory Visit | Attending: Internal Medicine | Admitting: Internal Medicine

## 2019-12-11 ENCOUNTER — Other Ambulatory Visit: Payer: Self-pay

## 2019-12-11 DIAGNOSIS — K769 Liver disease, unspecified: Secondary | ICD-10-CM

## 2019-12-11 MED ORDER — GADOBENATE DIMEGLUMINE 529 MG/ML IV SOLN
18.0000 mL | Freq: Once | INTRAVENOUS | Status: AC | PRN
  Administered 2019-12-11: 18 mL via INTRAVENOUS

## 2019-12-18 ENCOUNTER — Inpatient Hospital Stay (HOSPITAL_COMMUNITY)
Admission: EM | Admit: 2019-12-18 | Discharge: 2019-12-20 | DRG: 419 | Disposition: A | Discharge status: Home / Self Care | Payer: BC Managed Care – PPO | Attending: Surgery | Admitting: Surgery

## 2019-12-18 ENCOUNTER — Inpatient Hospital Stay: Admission: AD | Admit: 2019-12-18 | Payer: BC Managed Care – PPO | Source: Ambulatory Visit | Admitting: Surgery

## 2019-12-18 ENCOUNTER — Other Ambulatory Visit: Payer: Self-pay

## 2019-12-18 ENCOUNTER — Encounter (HOSPITAL_COMMUNITY): Payer: Self-pay

## 2019-12-18 ENCOUNTER — Ambulatory Visit: Payer: 59 | Admitting: Internal Medicine

## 2019-12-18 DIAGNOSIS — Z20822 Contact with and (suspected) exposure to covid-19: Secondary | ICD-10-CM | POA: Diagnosis present

## 2019-12-18 DIAGNOSIS — I10 Essential (primary) hypertension: Secondary | ICD-10-CM | POA: Diagnosis present

## 2019-12-18 DIAGNOSIS — R197 Diarrhea, unspecified: Secondary | ICD-10-CM | POA: Diagnosis present

## 2019-12-18 DIAGNOSIS — K8000 Calculus of gallbladder with acute cholecystitis without obstruction: Principal | ICD-10-CM | POA: Diagnosis present

## 2019-12-18 DIAGNOSIS — K8042 Calculus of bile duct with acute cholecystitis without obstruction: Secondary | ICD-10-CM | POA: Diagnosis not present

## 2019-12-18 DIAGNOSIS — F419 Anxiety disorder, unspecified: Secondary | ICD-10-CM | POA: Diagnosis present

## 2019-12-18 DIAGNOSIS — Z79899 Other long term (current) drug therapy: Secondary | ICD-10-CM

## 2019-12-18 DIAGNOSIS — K819 Cholecystitis, unspecified: Secondary | ICD-10-CM | POA: Diagnosis not present

## 2019-12-18 DIAGNOSIS — F329 Major depressive disorder, single episode, unspecified: Secondary | ICD-10-CM | POA: Diagnosis present

## 2019-12-18 DIAGNOSIS — Z87891 Personal history of nicotine dependence: Secondary | ICD-10-CM

## 2019-12-18 DIAGNOSIS — Z881 Allergy status to other antibiotic agents status: Secondary | ICD-10-CM

## 2019-12-18 DIAGNOSIS — Z79891 Long term (current) use of opiate analgesic: Secondary | ICD-10-CM | POA: Diagnosis not present

## 2019-12-18 DIAGNOSIS — R933 Abnormal findings on diagnostic imaging of other parts of digestive tract: Secondary | ICD-10-CM

## 2019-12-18 DIAGNOSIS — K625 Hemorrhage of anus and rectum: Secondary | ICD-10-CM

## 2019-12-18 DIAGNOSIS — Z880 Allergy status to penicillin: Secondary | ICD-10-CM

## 2019-12-18 DIAGNOSIS — R748 Abnormal levels of other serum enzymes: Secondary | ICD-10-CM | POA: Diagnosis not present

## 2019-12-18 DIAGNOSIS — Z419 Encounter for procedure for purposes other than remedying health state, unspecified: Secondary | ICD-10-CM

## 2019-12-18 DIAGNOSIS — Z7289 Other problems related to lifestyle: Secondary | ICD-10-CM | POA: Diagnosis not present

## 2019-12-18 LAB — COMPREHENSIVE METABOLIC PANEL
ALT: 723 U/L — ABNORMAL HIGH (ref 0–44)
AST: 464 U/L — ABNORMAL HIGH (ref 15–41)
Albumin: 4.4 g/dL (ref 3.5–5.0)
Alkaline Phosphatase: 125 U/L (ref 38–126)
Anion gap: 13 (ref 5–15)
BUN: 12 mg/dL (ref 6–20)
CO2: 25 mmol/L (ref 22–32)
Calcium: 9.5 mg/dL (ref 8.9–10.3)
Chloride: 103 mmol/L (ref 98–111)
Creatinine, Ser: 0.55 mg/dL (ref 0.44–1.00)
GFR calc Af Amer: >60 mL/min (ref >60)
GFR calc non Af Amer: >60 mL/min (ref >60)
Glucose, Bld: 99 mg/dL (ref 70–99)
Potassium: 4.1 mmol/L (ref 3.5–5.1)
Sodium: 141 mmol/L (ref 135–145)
Total Bilirubin: 4.2 mg/dL — ABNORMAL HIGH (ref 0.3–1.2)
Total Protein: 7.7 g/dL (ref 6.5–8.1)

## 2019-12-18 LAB — CBC
HCT: 42 % (ref 36.0–46.0)
Hemoglobin: 13.3 g/dL (ref 12.0–15.0)
MCH: 28 pg (ref 26.0–34.0)
MCHC: 31.7 g/dL (ref 30.0–36.0)
MCV: 88.4 fL (ref 80.0–100.0)
Platelets: 225 10*3/uL (ref 150–400)
RBC: 4.75 MIL/uL (ref 3.87–5.11)
RDW: 14.6 % (ref 11.5–15.5)
WBC: 8.4 10*3/uL (ref 4.0–10.5)
nRBC: 0 % (ref 0.0–0.2)

## 2019-12-18 LAB — PROTIME-INR
INR: 1 (ref 0.8–1.2)
Prothrombin Time: 12.3 seconds (ref 11.4–15.2)

## 2019-12-18 LAB — LIPASE, BLOOD: Lipase: 30 U/L (ref 11–51)

## 2019-12-18 LAB — I-STAT BETA HCG BLOOD, ED (MC, WL, AP ONLY): I-stat hCG, quantitative: <5 m[IU]/mL (ref <5)

## 2019-12-18 LAB — SARS CORONAVIRUS 2 BY RT PCR (HOSPITAL ORDER, PERFORMED IN ~~LOC~~ HOSPITAL LAB): SARS Coronavirus 2: NEGATIVE

## 2019-12-18 LAB — HIV ANTIBODY (ROUTINE TESTING W REFLEX): HIV Screen 4th Generation wRfx: NONREACTIVE

## 2019-12-18 MED ORDER — SERTRALINE HCL 100 MG PO TABS
100.0000 mg | ORAL_TABLET | Freq: Every day | ORAL | Status: DC
  Administered 2019-12-19: 100 mg via ORAL
  Filled 2019-12-18 (×2): qty 1

## 2019-12-18 MED ORDER — ACETAMINOPHEN 325 MG PO TABS
650.0000 mg | ORAL_TABLET | Freq: Four times a day (QID) | ORAL | Status: DC | PRN
  Administered 2019-12-18: 650 mg via ORAL
  Filled 2019-12-18: qty 2

## 2019-12-18 MED ORDER — ALPRAZOLAM 0.25 MG PO TABS: 0.2500 mg | ORAL_TABLET | Freq: Two times a day (BID) | ORAL | Status: DC | PRN

## 2019-12-18 MED ORDER — MORPHINE SULFATE (PF) 2 MG/ML IV SOLN
2.0000 mg | INTRAVENOUS | Status: DC | PRN
  Administered 2019-12-18: 2 mg via INTRAVENOUS
  Administered 2019-12-18 – 2019-12-19 (×2): 4 mg via INTRAVENOUS
  Administered 2019-12-20 (×2): 2 mg via INTRAVENOUS
  Filled 2019-12-18 (×2): qty 2
  Filled 2019-12-18: qty 1
  Filled 2019-12-18: qty 2
  Filled 2019-12-18: qty 1

## 2019-12-18 MED ORDER — ONDANSETRON HCL 4 MG/2ML IJ SOLN
4.0000 mg | Freq: Four times a day (QID) | INTRAMUSCULAR | Status: DC | PRN
  Administered 2019-12-19: 4 mg via INTRAVENOUS
  Filled 2019-12-18: qty 2

## 2019-12-18 MED ORDER — CIPROFLOXACIN IN D5W 400 MG/200ML IV SOLN
400.0000 mg | Freq: Two times a day (BID) | INTRAVENOUS | Status: DC
  Administered 2019-12-18 – 2019-12-19 (×2): 400 mg via INTRAVENOUS
  Filled 2019-12-18 (×2): qty 200

## 2019-12-18 MED ORDER — ONDANSETRON 4 MG PO TBDP: 4.0000 mg | ORAL_TABLET | Freq: Four times a day (QID) | ORAL | Status: DC | PRN

## 2019-12-18 MED ORDER — DIPHENHYDRAMINE HCL 50 MG/ML IJ SOLN: 25.0000 mg | Freq: Four times a day (QID) | INTRAMUSCULAR | Status: DC | PRN

## 2019-12-18 MED ORDER — ACETAMINOPHEN 650 MG RE SUPP: 650.0000 mg | Freq: Four times a day (QID) | RECTAL | Status: DC | PRN

## 2019-12-18 MED ORDER — DIPHENHYDRAMINE HCL 25 MG PO CAPS
25.0000 mg | ORAL_CAPSULE | Freq: Four times a day (QID) | ORAL | Status: DC | PRN
  Administered 2019-12-19 (×2): 25 mg via ORAL
  Filled 2019-12-18 (×2): qty 1

## 2019-12-18 MED ORDER — OXYCODONE-ACETAMINOPHEN 5-325 MG PO TABS
1.0000 | ORAL_TABLET | ORAL | Status: DC | PRN
  Administered 2019-12-18 – 2019-12-19 (×3): 1 via ORAL
  Administered 2019-12-19 – 2019-12-20 (×5): 2 via ORAL
  Filled 2019-12-18 (×3): qty 2
  Filled 2019-12-18: qty 1
  Filled 2019-12-18 (×2): qty 2
  Filled 2019-12-18 (×2): qty 1

## 2019-12-18 MED ORDER — MORPHINE SULFATE (PF) 2 MG/ML IV SOLN: 2.0000 mg | INTRAVENOUS | Status: DC | PRN

## 2019-12-18 NOTE — ED Triage Notes (Signed)
Pt arrives today c/o abd pain. Pt states that she went to her PCP, who drew labs. Pt's AST and ALT were extremely elevated, and PCP advised her to come here for gallbladder removal.

## 2019-12-18 NOTE — Consult Note (Signed)
Referring Provider:  [facility]         Primary Care Physician:  Marton Redwood, MD Primary Gastroenterologist: Scarlette Shorts, MD              We were asked to see this patient for:   Elevated LFTs / abdominal pain               ASSESSMENT / PLAN:   # Upper abdominal pain / cholelithiasis / new elevation in LFTs. Concern for choledocholithiasis.  --Was scheduled for outpatient cholecystectomy in October. Now with worsening symptoms and new marked elevation in LFTs.  Will obtain MRCP today, if positive then will need ERCP prior to cholecystectomy.  I went ahead and briefly discussed the procedure with patient and her husband.  --I have spoken with [facility]. They would like to proceed with cholecystectomy on Thursday.  --Patient afebrile, normal WBC. Surgery has started BID Cipro.  # Left liver lesion on [place] --MRCP earlier this month( done to evaluate the lesion) suggests lesion is a hemangioma.   # Intermittent diarrhea --6 months duration.  --Most of the time stools are formed.  --Often postprandial, can be worse with anxiety.  --Outpatient workup appropriate.    HPI:                                                                                                                             Chief Complaint: abdominal pain   Leslie Mathews is a 44 y.o. female with a pmh significant for, not necessarily limited to:   Patient was seen in ED on 11/12/19 with upper abdominal pain. Labs including lipase and LFTs were normal. [place] remarkable for cholelithiasis and hypoechoic area in left liver. Following that her PCP referred her to [facility] for possible cholecystectomy. He also ordered MRI / MRCP to evaluate liver findings on [place]. 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 No biliary duct dilation. She saw [facility] on 8/30 and is scheduled for cholecystectomy next month. In the interim patient has continued to have "gallbladder attacks" about once a week  consisting of upper abdomen pain radiating around both sides of upper abdomen and into right shoulder blade. She woke up around 3 am Saturday with same pain but more severe and associated with nausea and vomiting. She has been dealing with the pain since. She had some hydrocodone and oxycodone left over from knee surgery and has been taking that. Pain meds help, or at least make her fall asleep. She has continued to have the pain / pressure in upper abdomen, greatest in RUQ even into today. She has dark urine and light colored stools.  Nauseated today but no vomiting. Patient was due for routine labs for upcoming physical and she had those done today. LFTs found to be markedly elevated. Normal alk phos. AST 464, ALT 723, Tbili 4.2. Patient sent to ED.   Patient was  going to make an appt to see [place]. Over the last 6 months she has been having intermittent diarrhea. Diarrhea worse with anxiety but also occurs postprandially though not related to any particular foods. She has diarrhea about twice a week. The remainder of time her stools are formed but of small caliber. No blood in stool. She had a normal colonoscopy in 2018   [place] / PERTINENT STUDIES   Dec 2018 colonoscopy for diarrhea, abdominal pain and rectal bleeding.  --normal.   11/12/19 RUQ US IMPRESSION: Hypoechoic area in the left lobe of the liver. Further evaluation by MRI of the liver is recommended on a nonemergent basis to allow for optimum imaging. Cholelithiasis without complicating factors.  12/11/19 MRI / MRCP. Done to evaluate liver lesion on US --IMPRESSION: 1. 1.7 x 1.6 x 1.6 cm lesion in the left liver, corresponding to the abnormality seen on recent ultrasound. Lesion demonstrates flash filling enhancement pattern after IV contrast administration and although it cannot be definitively characterized, is felt to be most likely a benign etiology such as cavernous hemangioma. Follow-up MRI in 3-6 months  could be used to ensure stability. 2. Focal area of marrow enhancement identified in the right L5 vertebral body. This anatomy is only included on coronal imaging today a cannot be definitively characterized. While likely benign, short-term follow-up lumbar spine MRI with and without contrast recommended to further evaluate. 3. No intra or extrahepatic biliary duct dilatation. 4. Gallbladder is decompressed and stone disease seen on previous ultrasound is not readily evident on MRI today.    Past Medical History:  Diagnosis Date  . Anemia    in teenage yrs  . Anxiety    Depression in the past  . Delivery normal   . Depression   . Heart palpitations   . HTN (hypertension)   . Ovarian cyst 02/12/2011    Past Surgical History:  Procedure Laterality Date  . CYSTECTOMY    . FOOT FRACTURE SURGERY Left 02/09/2017   5th metatarsal    Prior to Admission medications   Medication Sig Start Date End Date Taking? Authorizing Provider  ALPRAZolam Duanne Moron) 0.5 MG tablet Take 0.5 mg by mouth as needed. Pt takes 1/2 to 1 tab as needed     [provider]  B Complex Vitamins (VITAMIN B COMPLEX PO) Take by mouth.    [provider]  HYDROcodone-acetaminophen (NORCO/VICODIN) 5-325 MG tablet Take 1 tablet by mouth every 6 (six) hours as needed for severe pain. 11/12/19   Venter, Margaux, PA-C  MAGNESIUM CARBONATE PO Take by mouth.    [provider]  meloxicam (MOBIC) 15 MG tablet Take 1 tablet (15 mg total) by mouth daily. Patient taking differently: Take 15 mg by mouth as needed.  12/30/13   Leandrew Koyanagi, MD  methocarbamol (ROBAXIN) 500 MG tablet Take 1 tablet by mouth daily. 09/15/16   [provider]  Multiple Vitamin (MULTI VITAMIN DAILY PO) Take by mouth.    [provider]  norethindrone (MICRONOR,CAMILA,ERRIN) 0.35 MG tablet Take 1 tablet by mouth daily. 11/14/16   [provider]  sertraline (ZOLOFT) 50 MG tablet Take 50 mg by  mouth daily.    [provider]  traMADol (ULTRAM) 50 MG tablet Take 1 tablet by mouth as needed. 10/19/16   [provider]    Current Facility-Administered Medications  Medication Dose Route Frequency Provider Last Rate Last Admin  . 0.9 %  sodium chloride infusion  500 mL Intravenous Once Henrene Pastor,  Docia Chuck, MD      . acetaminophen (TYLENOL) tablet 650 mg  650 mg Oral Q6H PRN Earnstine Regal, PA-C       Or  . acetaminophen (TYLENOL) suppository 650 mg  650 mg Rectal Q6H PRN Earnstine Regal, PA-C      . ciprofloxacin (CIPRO) IVPB 400 mg  400 mg Intravenous Q12H Earnstine Regal, PA-C      . diphenhydrAMINE (BENADRYL) capsule 25 mg  25 mg Oral Q6H PRN Earnstine Regal, PA-C       Or  . diphenhydrAMINE (BENADRYL) injection 25 mg  25 mg Intravenous Q6H PRN Earnstine Regal, PA-C      . morphine 2 MG/ML injection 2 mg  2 mg Intravenous Q2H PRN Earnstine Regal, PA-C      . ondansetron (ZOFRAN-ODT) disintegrating tablet 4 mg  4 mg Oral Q6H PRN Earnstine Regal, PA-C       Or  . ondansetron [facility]) injection 4 mg  4 mg Intravenous Q6H PRN Earnstine Regal, PA-C      . oxyCODONE-acetaminophen (PERCOCET/ROXICET) 5-325 MG per tablet 1-2 tablet  1-2 tablet Oral Q4H PRN Earnstine Regal, PA-C       Current Outpatient Medications  Medication Sig Dispense Refill  . ALPRAZolam (XANAX) 0.5 MG tablet Take 0.5 mg by mouth as needed. Pt takes 1/2 to 1 tab as needed     . B Complex Vitamins (VITAMIN B COMPLEX PO) Take by mouth.    Marland Kitchen HYDROcodone-acetaminophen (NORCO/VICODIN) 5-325 MG tablet Take 1 tablet by mouth every 6 (six) hours as needed for severe pain. 20 tablet 0  . MAGNESIUM CARBONATE PO Take by mouth.    . meloxicam (MOBIC) 15 MG tablet Take 1 tablet (15 mg total) by mouth daily. (Patient taking differently: Take 15 mg by mouth as needed. ) 10 tablet 0  . methocarbamol (ROBAXIN) 500 MG tablet Take 1 tablet by mouth daily.    . Multiple Vitamin (MULTI VITAMIN DAILY PO) Take  by mouth.    . norethindrone (MICRONOR,CAMILA,ERRIN) 0.35 MG tablet Take 1 tablet by mouth daily.    . sertraline (ZOLOFT) 50 MG tablet Take 50 mg by mouth daily.    . traMADol (ULTRAM) 50 MG tablet Take 1 tablet by mouth as needed.      Allergies as of 12/18/2019 - Review Complete 12/18/2019  Allergen Reaction Noted  . Azithromycin  10/11/2013  . Penicillins Rash 02/12/2011    Family History  Problem Relation Age of Onset  . Hypertension Mother   . ALS Mother   . Hyperlipidemia Mother   . Diabetes Father   . Multiple myeloma Father   . Hyperlipidemia Brother   . Hypertension Brother   . Colon polyps Brother   . Colon cancer Neg Hx   . Rectal cancer Neg Hx     Social History   Socioeconomic History  . Marital status: Married    Spouse name: Not on file  . Number of children: 1  . Years of education: Not on file  . Highest education level: Not on file  Occupational History  . Occupation: Pharmacist, hospital  Tobacco Use  . Smoking status: Former Smoker    Start date: 02/11/1997    Quit date: 04/06/2007    Years since quitting: 12.7  . Smokeless tobacco: Never Used  Vaping Use  . Vaping Use: Never used  Substance and Sexual Activity  . Alcohol use: Yes    Alcohol/week: 7.0 standard drinks    Types: 7 Standard drinks or equivalent  per week    Comment: Glass of wine everyday  . Drug use: No  . Sexual activity: Yes  Other Topics Concern  . Not on file  Social History Narrative  . Not on file   Social Determinants of Health   Financial Resource Strain:   . Difficulty of Paying Living Expenses: Not on file  Food Insecurity:   . Worried About Charity fundraiser in the Last Year: Not on file  . Ran Out of Food in the Last Year: Not on file  Transportation Needs:   . Lack of Transportation (Medical): Not on file  . Lack of Transportation (Non-Medical): Not on file  Physical Activity:   . Days of Exercise per Week: Not on file  . Minutes of Exercise per Session: Not on file   Stress:   . Feeling of Stress : Not on file  Social Connections:   . Frequency of Communication with Friends and Family: Not on file  . Frequency of Social Gatherings with Friends and Family: Not on file  . Attends Religious Services: Not on file  . Active Member of Clubs or Organizations: Not on file  . Attends Archivist Meetings: Not on file  . Marital Status: Not on file  Intimate Partner Violence:   . Fear of Current or Ex-Partner: Not on file  . Emotionally Abused: Not on file  . Physically Abused: Not on file  . Sexually Abused: Not on file    Review of Systems: All systems reviewed and negative except where noted in HPI.  OBJECTIVE:    Physical Exam: Vital signs in last 24 hours: Temp:  [98.3 F (36.8 C)] 98.3 F (36.8 C) (09/14 1137) Pulse Rate:  [82] 82 (09/14 1137) Resp:  [16] 16 (09/14 1137) BP: (124)/(88) 124/88 (09/14 1137) SpO2:  [97 %] 97 % (09/14 1137) Weight:  [81.6 kg] 81.6 kg (09/14 1137)   General:   Alert, well-developed female in NAD Psych:  Pleasant, cooperative. Normal mood and affect. Eyes:  Pupils equal, sclera clear, no icterus.   Conjunctiva pink. Ears:  Normal auditory acuity. Nose:  No deformity, discharge,  or lesions. Neck:  Supple; no masses Lungs:  Clear throughout to auscultation.   No wheezes, crackles, or rhonchi.  Heart:  Regular rate and rhythm; no murmurs, no lower extremity edema Abdomen:  Soft, non-distended, mild epigastric tenderness. BS active, no palp mass   Rectal:  Deferred  Msk:  Symmetrical without gross deformities. . Neurologic:  Alert and  oriented x4;  grossly normal neurologically. Skin:  Intact without significant lesions or rashes.  Filed Weights   12/18/19 1137  Weight: 81.6 kg     Scheduled inpatient medications     Intake/Output from previous day: No intake/output data recorded. Intake/Output this shift: No intake/output data recorded.   Lab Results: Recent Labs    12/18/19 1224   WBC 8.4  HGB 13.3  HCT 42.0  PLT 225   BMET Recent Labs    12/18/19 1224  NA 141  K 4.1  CL 103  CO2 25  GLUCOSE 99  BUN 12  CREATININE 0.55  CALCIUM 9.5   LFT Recent Labs    12/18/19 1224  PROT 7.7  ALBUMIN 4.4  AST 464*  ALT 723*  ALKPHOS 125  BILITOT 4.2*   PT/INR No results for input(s): LABPROT, INR in the last 72 hours. Hepatitis Panel No results for input(s): HEPBSAG, HCVAB, HEPAIGM, HEPBIGM in the last 72 hours.   Marland Kitchen  CBC Latest Ref Rng & Units 12/18/2019 11/12/2019 05/11/2008  WBC 4.0 - 10.5 K/uL 8.4 9.4 10.5  Hemoglobin 12.0 - 15.0 g/dL 13.3 14.1 11.7(L)  Hematocrit 36 - 46 % 42.0 45.0 35.7(L)  Platelets 150 - 400 K/uL 225 247 362    . CMP Latest Ref Rng & Units 12/18/2019 11/12/2019 04/26/2008  Glucose 70 - 99 mg/dL 99 95 64(L)  BUN 6 - 20 mg/dL _0 Creatinine 0.44 - 1.00 mg/dL 0.55 0.70 0.44  Sodium 135 - 145 mmol/L 141 132(L) 134(L)  Potassium 3.5 - 5.1 mmol/L 4.1 4.1 4.3  Chloride 98 - 111 mmol/L 103 101 106  CO2 22 - 32 mmol/L _1 Calcium 8.9 - 10.3 mg/dL 9.5 8.9 9.2  Total Protein 6.5 - 8.1 g/dL 7.7 7.8 5.7(L)  Total Bilirubin 0.3 - 1.2 mg/dL 4.2(H) 0.3 0.5  Alkaline Phos 38 - 126 U/L 125 45 134(H)  AST 15 - 41 U/L 464(H) 20 22  ALT 0 - 44 U/L 723(H) 19 17   Studies/Results: No results found.  Active Problems:   Choledocholithiasis with acute cholecystitis    Tye Savoy, NP-C @  12/18/2019, 3:26 PM

## 2019-12-18 NOTE — H&P (Signed)
Leslie Mathews Admission Note  Leslie Mathews [birth year]  [id number].    PCP:  Leslie Mathews GI: Leslie Mathews -  colonoscopy 2008 & 2018 Chief Complaint: abdominal pain with  Elevated LFT's Reason for Consult: Hx of cholelithiasis with chronic cholelithiasis  HPI:  Pt is a 44 y/o female who was seen in the ED on 11/12/19, with abdominal pain.  Work up with US showed gallstones and hypoechoic area mid left liver. MRI ordered by Dr. Brigitte Mathews on 12/11/19, that showed what they thought was a hemangioma; GB with stones not readily seen on MRI on this study.  No intra or extrahepatic or ductal dilatation on this study; no choledocholithiasis.  LFT's were normal in August.  She has been having daily episodes of pain, since she was seen on 11/12/19 in the ED. She was referred to DR. Tsuei, and seen on 12/03/19 and scheduled for cholecystectomy in October.  She reported 6 months of constant pressure RUQ, pain going to the right shoulder, worse with eating, some nausea and diarrhea. Saturday 12/15/19, she got pain again and it was the worst she has had, she also started having nausea and vomiting which was new; she also noted brown colored urine, tan stools and today she thought her eyes were a bit yellow.    She saw Dr. Brigitte Mathews for lab work today and CMP shows:  T bil 5.0, AST 565,ALT 800, Alk phos 136.  Remainder of the CMP is normal, Lipid profile is normal.  She was sent to the ED for evaluation.   Work up here shows T bil 4.2, AST 464, ALT 723.  CBC, UA, are normal.      ROS: Review of Systems  Constitutional: Negative.   HENT: Negative.   Eyes: Negative.   Respiratory: Negative.   Cardiovascular: Negative.   Gastrointestinal: Positive for abdominal pain, diarrhea (up to 6 times per day), nausea and vomiting. Negative for blood in stool, constipation, heartburn and melena.  Genitourinary: Negative.   Musculoskeletal: Negative.   Skin: Negative.   Neurological: Negative.   Endo/Heme/Allergies:  Negative.   Psychiatric/Behavioral: Positive for depression. The patient is nervous/anxious.     Family History  Problem Relation Age of Onset  . Hypertension Mother   . ALS Mother   . Hyperlipidemia Mother   . Diabetes Father   . Multiple myeloma Father   . Hyperlipidemia Brother   . Hypertension Brother   . Colon polyps Brother   . Colon cancer Neg Hx   . Rectal cancer Neg Hx     Past Medical History:  Diagnosis Date  . Anemia    in teenage yrs  . Anxiety    Depression in the past  . Delivery normal   . Depression   . Heart palpitations   . HTN (hypertension)   . Ovarian cyst 02/12/2011    Past Surgical History:  Procedure Laterality Date  . CYSTECTOMY    . FOOT FRACTURE Mathews Left 02/09/2017   5th metatarsal    Social History:  reports that she quit smoking about 12 years ago. She started smoking about 22 years ago. She has never used smokeless tobacco. She reports current alcohol use of about 7.0 standard drinks of alcohol per week. She reports that she does not use drugs.  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  Azithromycin     Rash- mild  . Penicillins Rash    As a child    Prior to Admission medications   Medication Sig Start Date End Date Taking? Authorizing Provider  ALPRAZolam Duanne Moron) 0.5 MG tablet Take 0.5 mg by mouth as needed. Pt takes 1/2 to 1 tab as needed     [provider]  B Complex Vitamins (VITAMIN B COMPLEX PO) Take by mouth.    [provider]  HYDROcodone-acetaminophen (NORCO/VICODIN) 5-325 MG tablet Take 1 tablet by mouth every 6 (six) hours as needed for severe pain. 11/12/19   Venter, Margaux, PA-C  MAGNESIUM CARBONATE PO Take by mouth.    [provider]  meloxicam (MOBIC) 15 MG tablet Take 1 tablet (15 mg total) by mouth daily. Patient  taking differently: Take 15 mg by mouth as needed.  12/30/13   Leandrew Koyanagi, MD  methocarbamol (ROBAXIN) 500 MG tablet Take 1 tablet by mouth daily. 09/15/16   [provider]  Multiple Vitamin (MULTI VITAMIN DAILY PO) Take by mouth.    [provider]  norethindrone (MICRONOR,CAMILA,ERRIN) 0.35 MG tablet Take 1 tablet by mouth daily. 11/14/16   [provider]  sertraline (ZOLOFT) 50 MG tablet Take 50 mg by mouth daily.    [provider]  traMADol (ULTRAM) 50 MG tablet Take 1 tablet by mouth as needed. 10/19/16   [provider]     Blood pressure 124/88, Mathews 82, temperature 98.3 F (36.8 C), temperature source Oral, resp. rate 16, height _0  (1.676 m), weight 81.6 kg, SpO2 97 %. Physical Exam:  General: pleasant, WD, WN white female who is  in Triage in NAD HEENT: head is normocephalic, atraumatic.  Sclera are ?mildly jaundice.  Pupils are equal.  Ears and nose without any masses or lesions.  Mouth is pink and moist Heart: regular, rate, and rhythm.  Normal s1,s2. No obvious murmurs, gallops, or rubs noted.  Palpable radial and pedal pulses bilaterally Lungs: CTAB, no wheezes, rhonchi, or rales noted.  Respiratory effort nonlabored Abd: soft, NT, ND, +BS, no masses, hernias, or organomegaly.  Currently she is pain free.  MS: all 4 extremities are symmetrical with no cyanosis, clubbing, or edema. Skin: warm and dry with no masses, lesions, or rashes Neuro: Cranial nerves 2-12 grossly intact, sensation is normal throughout Psych: A&Ox3 with an appropriate affect.   No results found for this or any previous visit (from the past 48 hour(s)). No results found.    Assessment/Plan Hx anxiety disorder Hx heart murmur Hx tobacco use COVID vaccine # 2 -  12/17/19  Elevated LFT's possible choledocholithiasis vs passing a stone Symptomatic cholelithiasis/chronic cholecystitis  Plan:  Admit to DR Tsuei;  LFT's still elevated, I have talked  with GI and they plan to repeat MRCP today hopefully;  clear liquids and IV fluids tonight. NPO after MN, Cipro IV(allergy to PCN), ERCP if indicated and then cholecystectomy this hospitalization.     Earnstine Regal [facility] Mathews 12/18/2019, 12:38 PM Please see Amion for pager number during day hours 7:00am-4:30pm

## 2019-12-18 NOTE — Plan of Care (Signed)
Plan of care discussed.   

## 2019-12-19 ENCOUNTER — Inpatient Hospital Stay (HOSPITAL_COMMUNITY): Payer: BC Managed Care – PPO

## 2019-12-19 DIAGNOSIS — K819 Cholecystitis, unspecified: Secondary | ICD-10-CM

## 2019-12-19 LAB — COMPREHENSIVE METABOLIC PANEL
ALT: 508 U/L — ABNORMAL HIGH (ref 0–44)
AST: 249 U/L — ABNORMAL HIGH (ref 15–41)
Albumin: 4 g/dL (ref 3.5–5.0)
Alkaline Phosphatase: 122 U/L (ref 38–126)
Anion gap: 8 (ref 5–15)
BUN: 7 mg/dL (ref 6–20)
CO2: 26 mmol/L (ref 22–32)
Calcium: 8.9 mg/dL (ref 8.9–10.3)
Chloride: 101 mmol/L (ref 98–111)
Creatinine, Ser: 0.61 mg/dL (ref 0.44–1.00)
GFR calc Af Amer: >60 mL/min (ref >60)
GFR calc non Af Amer: >60 mL/min (ref >60)
Glucose, Bld: 121 mg/dL — ABNORMAL HIGH (ref 70–99)
Potassium: 3.6 mmol/L (ref 3.5–5.1)
Sodium: 135 mmol/L (ref 135–145)
Total Bilirubin: 3.8 mg/dL — ABNORMAL HIGH (ref 0.3–1.2)
Total Protein: 6.9 g/dL (ref 6.5–8.1)

## 2019-12-19 LAB — CBC
HCT: 38.4 % (ref 36.0–46.0)
Hemoglobin: 11.9 g/dL — ABNORMAL LOW (ref 12.0–15.0)
MCH: 27.4 pg (ref 26.0–34.0)
MCHC: 31 g/dL (ref 30.0–36.0)
MCV: 88.3 fL (ref 80.0–100.0)
Platelets: 181 10*3/uL (ref 150–400)
RBC: 4.35 MIL/uL (ref 3.87–5.11)
RDW: 14.6 % (ref 11.5–15.5)
WBC: 8.7 10*3/uL (ref 4.0–10.5)
nRBC: 0 % (ref 0.0–0.2)

## 2019-12-19 MED ORDER — SODIUM CHLORIDE 0.9 % IV SOLN
2.0000 g | INTRAVENOUS | Status: DC
  Administered 2019-12-19 – 2019-12-20 (×2): 2 g via INTRAVENOUS
  Filled 2019-12-19 (×2): qty 2
  Filled 2019-12-19: qty 20

## 2019-12-19 MED ORDER — GADOBUTROL 1 MMOL/ML IV SOLN
8.0000 mL | Freq: Once | INTRAVENOUS | Status: AC | PRN
  Administered 2019-12-19: 8 mL via INTRAVENOUS

## 2019-12-19 MED ORDER — SODIUM CHLORIDE 0.9 % IV SOLN
INTRAVENOUS | Status: DC | PRN
  Administered 2019-12-19: 250 mL via INTRAVENOUS

## 2019-12-19 MED ORDER — SODIUM CHLORIDE 0.9 % IV SOLN: INTRAVENOUS | Status: DC | PRN

## 2019-12-19 NOTE — Progress Notes (Signed)
Transported to MRI at this time

## 2019-12-19 NOTE — Progress Notes (Signed)
MRCP negative for CBD dilation or CBD sones  Liver lesion consistent with benign hemangioma  Gall bladder wall thickening and distension consistent with cholecystitis. Follow up plan and Cholecystectomy as per surgery  GI will sign off, available if have questions  K. Scherry Ran , MD [phone]

## 2019-12-19 NOTE — Progress Notes (Signed)
Subjective/Chief Complaint: Patient still has some RUQ tenderness  MRCP - no CBD stones; inflammation around GB; no stones seen in GB   Objective: Vital signs in last 24 hours: Temp:  [97.7 F (36.5 C)-99.4 F (37.4 C)] 97.7 F (36.5 C) (09/15 0421) Pulse Rate:  [55-82] 58 (09/15 0421) Resp:  [16-18] 16 (09/15 0421) BP: (105-134)/(63-91) 112/63 (09/15 0421) SpO2:  [97 %-100 %] 99 % (09/15 0421) Weight:  [81.6 kg] 81.6 kg (09/14 1137) Last BM Date: 12/18/19  Intake/Output from previous day: 09/14 0701 - 09/15 0700 In: 1006.5 [P.O.:597; I.V.:9.1; IV Piggyback:400.4] Out: 900 [Urine:900] Intake/Output this shift: No intake/output data recorded.  WDWN in NAD Abd - soft, RUQ tenderness Skin - mild jaundice  Lab Results:  Recent Labs    12/18/19 1224 12/19/19 0341  WBC 8.4 8.7  HGB 13.3 11.9*  HCT 42.0 38.4  PLT 225 181   BMET Recent Labs    12/18/19 1224 12/19/19 0341  NA 141 135  K 4.1 3.6  CL 103 101  CO2 25 26  GLUCOSE 99 121*  BUN 12 7  CREATININE 0.55 0.61  CALCIUM 9.5 8.9   Hepatic Function Latest Ref Rng & Units 12/19/2019 12/18/2019 11/12/2019  Total Protein 6.5 - 8.1 g/dL 6.9 7.7 7.8  Albumin 3.5 - 5.0 g/dL 4.0 4.4 4.4  AST 15 - 41 U/L 249(H) 464(H) 20  ALT 0 - 44 U/L 508(H) 723(H) 19  Alk Phosphatase 38 - 126 U/L 122 125 45  Total Bilirubin 0.3 - 1.2 mg/dL 3.8(H) 4.2(H) 0.3    PT/INR Recent Labs    12/18/19 1523  LABPROT 12.3  INR 1.0   ABG No results for input(s): PHART, HCO3 in the last 72 hours.  Invalid input(s): PCO2, PO2  Studies/Results: MR 3D Recon At Scanner  Result Date: 12/19/2019 CLINICAL DATA:  Right upper quadrant abdominal pain. EXAM: MRI ABDOMEN WITHOUT AND WITH CONTRAST (INCLUDING MRCP) TECHNIQUE: Multiplanar multisequence MR imaging of the abdomen was performed both before and after the administration of intravenous contrast. Heavily T2-weighted images of the biliary and pancreatic ducts were obtained, and  three-dimensional MRCP images were rendered by post processing. CONTRAST:  35m GADAVIST GADOBUTROL 1 MMOL/ML IV SOLN COMPARISON:  12/11/2019 FINDINGS: Lower chest: The lung bases are clear of an acute process. No pleural or pericardial effusion. Hepatobiliary: 18 mm segment 4A liver lesion has typical MR findings for benign hepatic hemangioma. Bright T2 signal intensity with lobulated appearance and subsequent peripheral nodular enhancement with complete filling in on delayed images. No further imaging evaluation is necessary. 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 No intrahepatic biliary dilatation is identified. There is moderate gallbladder wall thickening and pericholecystic fluid. There is also some fluid around the descending duodenum and in the right anterior pararenal space. I do not see any definite gallstones. Normal caliber and course of the common bile duct. No common bile duct stones. Findings could reflect a calculus cholecystitis. Nuclear medicine hepatic biliary scan may be helpful for further evaluation. Pancreas:  No mass, inflammation or ductal dilatation. Spleen:  Normal size. No focal lesions. Adrenals/Urinary Tract: The adrenal glands and kidneys are normal in stable. Small left renal cyst again noted. Stomach/Bowel: Stomach, duodenum, visualized small bowel and visualized colon are unremarkable. Vascular/Lymphatic: The aorta and branch vessels are normal. The major venous structures are patent. No mesenteric or retroperitoneal mass or adenopathy. Other:  No ascites or  abdominal wall hernia. Musculoskeletal: No significant bony findings. IMPRESSION: 1. Moderate gallbladder wall thickening and pericholecystic fluid. Findings could reflect acalculus cholecystitis. No biliary dilatation or common bile duct stone. Nuclear medicine hepatic biliary scan may be helpful for further evaluation. 2. 18 mm  segment 4A liver lesion has typical MR imaging features of a benign hepatic hemangioma. Electronically Signed   By: Marijo Sanes M.D.   On: 12/19/2019 07:49   MR ABDOMEN MRCP W WO CONTAST  Result Date: 12/19/2019 CLINICAL DATA:  Right upper quadrant abdominal pain. EXAM: MRI ABDOMEN WITHOUT AND WITH CONTRAST (INCLUDING MRCP) TECHNIQUE: Multiplanar multisequence MR imaging of the abdomen was performed both before and after the administration of intravenous contrast. Heavily T2-weighted images of the biliary and pancreatic ducts were obtained, and three-dimensional MRCP images were rendered by post processing. CONTRAST:  23m GADAVIST GADOBUTROL 1 MMOL/ML IV SOLN COMPARISON:  12/11/2019 FINDINGS: Lower chest: The lung bases are clear of an acute process. No pleural or pericardial effusion. Hepatobiliary: 18 mm segment 4A liver lesion has typical MR findings for benign hepatic hemangioma. Bright T2 signal intensity with lobulated appearance and subsequent peripheral nodular enhancement with complete filling in on delayed images. No further imaging evaluation is necessary. 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 No intrahepatic biliary dilatation is identified. There is moderate gallbladder wall thickening and pericholecystic fluid. There is also some fluid around the descending duodenum and in the right anterior pararenal space. I do not see any definite gallstones. Normal caliber and course of the common bile duct. No common bile duct stones. Findings could reflect a calculus cholecystitis. Nuclear medicine hepatic biliary scan may be helpful for further evaluation. Pancreas:  No mass, inflammation or ductal dilatation. Spleen:  Normal size. No focal lesions. Adrenals/Urinary Tract: The adrenal glands and kidneys are normal in stable. Small left renal cyst again noted. Stomach/Bowel: Stomach, duodenum, visualized  small bowel and visualized colon are unremarkable. Vascular/Lymphatic: The aorta and branch vessels are normal. The major venous structures are patent. No mesenteric or retroperitoneal mass or adenopathy. Other:  No ascites or abdominal wall hernia. Musculoskeletal: No significant bony findings. IMPRESSION: 1. Moderate gallbladder wall thickening and pericholecystic fluid. Findings could reflect acalculus cholecystitis. No biliary dilatation or common bile duct stone. Nuclear medicine hepatic biliary scan may be helpful for further evaluation. 2. 18 mm segment 4A liver lesion has typical MR imaging features of a benign hepatic hemangioma. Electronically Signed   By: PMarijo SanesM.D.   On: 12/19/2019 07:49    Anti-infectives: Anti-infectives (From admission, onward)   Start     Dose/Rate Route Frequency Ordered Stop   12/19/19 1000  cefTRIAXone (ROCEPHIN) 2 g in sodium chloride 0.9 % 100 mL IVPB        2 g 200 mL/hr over 30 Minutes Intravenous Every 24 hours 12/19/19 0843     12/18/19 1500  ciprofloxacin (CIPRO) IVPB 400 mg  Status:  Discontinued        400 mg 200 mL/hr over 60 Minutes Intravenous Every 12 hours 12/18/19 1446 12/19/19 0842      Assessment/Plan: Acute cholecystitis Patient had gallstones on previous [place] but none seen today on MRCP.  It is likely that she passed these stones, causing a transient hyperbilirubinemia.  No sign of CBD obstruction today, so we will proceed with lap chole with IOC as scheduled tomorrow morning.    The surgical procedure has been discussed  with the patient.  Potential risks, benefits, alternative treatments, and expected outcomes have been explained.  All of the patient's questions at this time have been answered.  The likelihood of reaching the patient's treatment goal is good.  The patient understand the proposed surgical procedure and wishes to proceed.   LOS: 1 day    Maia Petties 12/19/2019

## 2019-12-20 ENCOUNTER — Inpatient Hospital Stay (HOSPITAL_COMMUNITY): Payer: BC Managed Care – PPO | Admitting: Registered Nurse

## 2019-12-20 ENCOUNTER — Encounter (HOSPITAL_COMMUNITY)
Admission: EM | Disposition: A | Discharge status: Home / Self Care | Payer: Self-pay | Source: Home / Self Care | Attending: Surgery

## 2019-12-20 ENCOUNTER — Inpatient Hospital Stay (HOSPITAL_COMMUNITY): Payer: BC Managed Care – PPO

## 2019-12-20 ENCOUNTER — Encounter (HOSPITAL_COMMUNITY): Payer: Self-pay | Admitting: Surgery

## 2019-12-20 HISTORY — PX: CHOLECYSTECTOMY: SHX55

## 2019-12-20 LAB — SURGICAL PCR SCREEN
MRSA, PCR: NEGATIVE
Staphylococcus aureus: NEGATIVE

## 2019-12-20 SURGERY — LAPAROSCOPIC CHOLECYSTECTOMY WITH INTRAOPERATIVE CHOLANGIOGRAM
Anesthesia: General

## 2019-12-20 MED ORDER — FENTANYL CITRATE (PF) 100 MCG/2ML IJ SOLN
INTRAMUSCULAR | Status: AC
  Filled 2019-12-20: qty 2

## 2019-12-20 MED ORDER — ONDANSETRON HCL 4 MG/2ML IJ SOLN
INTRAMUSCULAR | Status: DC | PRN
  Administered 2019-12-20: 4 mg via INTRAVENOUS

## 2019-12-20 MED ORDER — MIDAZOLAM HCL 2 MG/2ML IJ SOLN
INTRAMUSCULAR | Status: AC
  Filled 2019-12-20: qty 2

## 2019-12-20 MED ORDER — OXYCODONE HCL 5 MG PO TABS: 5.0000 mg | ORAL_TABLET | Freq: Four times a day (QID) | ORAL | 0 refills | Status: DC | PRN

## 2019-12-20 MED ORDER — BUPIVACAINE-EPINEPHRINE (PF) 0.25% -1:200000 IJ SOLN
INTRAMUSCULAR | Status: AC
  Filled 2019-12-20: qty 30

## 2019-12-20 MED ORDER — SCOPOLAMINE 1 MG/3DAYS TD PT72
MEDICATED_PATCH | TRANSDERMAL | Status: DC | PRN
  Administered 2019-12-20: 1 via TRANSDERMAL

## 2019-12-20 MED ORDER — FENTANYL CITRATE (PF) 250 MCG/5ML IJ SOLN
INTRAMUSCULAR | Status: DC | PRN
Start: 2019-12-20 — End: 2019-12-20
  Administered 2019-12-20: 50 ug via INTRAVENOUS
  Administered 2019-12-20: 100 ug via INTRAVENOUS
  Administered 2019-12-20 (×2): 50 ug via INTRAVENOUS

## 2019-12-20 MED ORDER — MEPERIDINE HCL 50 MG/ML IJ SOLN: 6.2500 mg | INTRAMUSCULAR | Status: DC | PRN

## 2019-12-20 MED ORDER — KETOROLAC TROMETHAMINE 30 MG/ML IJ SOLN
INTRAMUSCULAR | Status: DC | PRN
  Administered 2019-12-20: 30 mg via INTRAVENOUS

## 2019-12-20 MED ORDER — ROCURONIUM BROMIDE 10 MG/ML (PF) SYRINGE
PREFILLED_SYRINGE | INTRAVENOUS | Status: AC
  Filled 2019-12-20: qty 10

## 2019-12-20 MED ORDER — LACTATED RINGERS IV SOLN: INTRAVENOUS | Status: DC

## 2019-12-20 MED ORDER — BUPIVACAINE-EPINEPHRINE 0.25% -1:200000 IJ SOLN
INTRAMUSCULAR | Status: DC | PRN
  Administered 2019-12-20: 18 mL

## 2019-12-20 MED ORDER — DEXAMETHASONE SODIUM PHOSPHATE 10 MG/ML IJ SOLN
INTRAMUSCULAR | Status: AC
  Filled 2019-12-20: qty 1

## 2019-12-20 MED ORDER — AMISULPRIDE (ANTIEMETIC) 5 MG/2ML IV SOLN: 10.0000 mg | Freq: Once | INTRAVENOUS | Status: DC | PRN

## 2019-12-20 MED ORDER — ALPRAZOLAM 0.5 MG PO TABS: ORAL_TABLET | ORAL | Status: AC

## 2019-12-20 MED ORDER — SCOPOLAMINE 1 MG/3DAYS TD PT72
MEDICATED_PATCH | TRANSDERMAL | Status: AC
  Filled 2019-12-20: qty 1

## 2019-12-20 MED ORDER — OXYCODONE HCL 5 MG/5ML PO SOLN: 5.0000 mg | Freq: Once | ORAL | Status: DC | PRN

## 2019-12-20 MED ORDER — FENTANYL CITRATE (PF) 100 MCG/2ML IJ SOLN
25.0000 ug | INTRAMUSCULAR | Status: DC | PRN
  Administered 2019-12-20 (×2): 50 ug via INTRAVENOUS

## 2019-12-20 MED ORDER — ONDANSETRON HCL 4 MG/2ML IJ SOLN
INTRAMUSCULAR | Status: AC
  Filled 2019-12-20: qty 2

## 2019-12-20 MED ORDER — PROPOFOL 10 MG/ML IV BOLUS
INTRAVENOUS | Status: AC
  Filled 2019-12-20: qty 20

## 2019-12-20 MED ORDER — DEXAMETHASONE SODIUM PHOSPHATE 10 MG/ML IJ SOLN
INTRAMUSCULAR | Status: DC | PRN
  Administered 2019-12-20: 10 mg via INTRAVENOUS

## 2019-12-20 MED ORDER — FENTANYL CITRATE (PF) 250 MCG/5ML IJ SOLN
INTRAMUSCULAR | Status: AC
  Filled 2019-12-20: qty 5

## 2019-12-20 MED ORDER — PROPOFOL 10 MG/ML IV BOLUS
INTRAVENOUS | Status: DC | PRN
  Administered 2019-12-20: 140 mg via INTRAVENOUS

## 2019-12-20 MED ORDER — 0.9 % SODIUM CHLORIDE (POUR BTL) OPTIME
TOPICAL | Status: DC | PRN
  Administered 2019-12-20: 1000 mL

## 2019-12-20 MED ORDER — KETOROLAC TROMETHAMINE 30 MG/ML IJ SOLN
INTRAMUSCULAR | Status: AC
  Filled 2019-12-20: qty 1

## 2019-12-20 MED ORDER — IOHEXOL 300 MG/ML  SOLN
INTRAMUSCULAR | Status: DC | PRN
  Administered 2019-12-20: 4 mL

## 2019-12-20 MED ORDER — ACETAMINOPHEN 10 MG/ML IV SOLN
INTRAVENOUS | Status: AC
  Filled 2019-12-20: qty 100

## 2019-12-20 MED ORDER — LACTATED RINGERS IR SOLN
Status: DC | PRN
  Administered 2019-12-20: 1000 mL

## 2019-12-20 MED ORDER — LIDOCAINE 2% (20 MG/ML) 5 ML SYRINGE
INTRAMUSCULAR | Status: DC | PRN
  Administered 2019-12-20: 40 mg via INTRAVENOUS

## 2019-12-20 MED ORDER — LIDOCAINE 2% (20 MG/ML) 5 ML SYRINGE
INTRAMUSCULAR | Status: AC
  Filled 2019-12-20: qty 5

## 2019-12-20 MED ORDER — ACETAMINOPHEN 160 MG/5ML PO SOLN: 325.0000 mg | Freq: Once | ORAL | Status: DC | PRN

## 2019-12-20 MED ORDER — OXYCODONE HCL 5 MG PO TABS: 5.0000 mg | ORAL_TABLET | Freq: Once | ORAL | Status: DC | PRN

## 2019-12-20 MED ORDER — MIDAZOLAM HCL 5 MG/5ML IJ SOLN
INTRAMUSCULAR | Status: DC | PRN
  Administered 2019-12-20: 2 mg via INTRAVENOUS

## 2019-12-20 MED ORDER — ACETAMINOPHEN 10 MG/ML IV SOLN
1000.0000 mg | Freq: Once | INTRAVENOUS | Status: DC | PRN
  Administered 2019-12-20: 1000 mg via INTRAVENOUS

## 2019-12-20 MED ORDER — ACETAMINOPHEN 325 MG PO TABS: ORAL_TABLET | ORAL | Status: DC

## 2019-12-20 MED ORDER — ROCURONIUM BROMIDE 10 MG/ML (PF) SYRINGE
PREFILLED_SYRINGE | INTRAVENOUS | Status: DC | PRN
  Administered 2019-12-20: 60 mg via INTRAVENOUS

## 2019-12-20 MED ORDER — ACETAMINOPHEN 325 MG PO TABS: 325.0000 mg | ORAL_TABLET | Freq: Once | ORAL | Status: DC | PRN

## 2019-12-20 MED ORDER — SUGAMMADEX SODIUM 200 MG/2ML IV SOLN
INTRAVENOUS | Status: DC | PRN
  Administered 2019-12-20: 200 mg via INTRAVENOUS

## 2019-12-20 SURGICAL SUPPLY — 46 items
APL PRP STRL LF DISP 70% ISPRP (MISCELLANEOUS) ×1
APL SKNCLS STERI-STRIP NONHPOA (GAUZE/BANDAGES/DRESSINGS) ×1
APPLIER CLIP ROT 10 11.4 M/L (STAPLE) ×3
APR CLP MED LRG 11.4X10 (STAPLE) ×1
BAG SPEC RTRVL LRG 6X4 10 (ENDOMECHANICALS) ×1
BENZOIN TINCTURE PRP APPL 2/3 (GAUZE/BANDAGES/DRESSINGS) ×3 IMPLANT
CHLORAPREP W/TINT 26 (MISCELLANEOUS) ×3 IMPLANT
CLIP APPLIE ROT 10 11.4 M/L (STAPLE) ×1 IMPLANT
CLOSURE WOUND 1/2 X4 (GAUZE/BANDAGES/DRESSINGS) ×1
COVER MAYO STAND STRL (DRAPES) ×3 IMPLANT
COVER SURGICAL LIGHT HANDLE (MISCELLANEOUS) ×3 IMPLANT
COVER WAND RF STERILE (DRAPES) ×2 IMPLANT
DECANTER SPIKE VIAL GLASS SM (MISCELLANEOUS) ×3 IMPLANT
DRAPE C-ARM 42X120 X-RAY (DRAPES) ×3 IMPLANT
DRSG TEGADERM 2-3/8X2-3/4 SM (GAUZE/BANDAGES/DRESSINGS) ×9 IMPLANT
DRSG TEGADERM 4X4.75 (GAUZE/BANDAGES/DRESSINGS) ×3 IMPLANT
ELECT REM PT RETURN 15FT ADLT (MISCELLANEOUS) ×3 IMPLANT
FILTER SMOKE EVAC LAPAROSHD (FILTER) ×3 IMPLANT
GAUZE SPONGE 2X2 8PLY STRL LF (GAUZE/BANDAGES/DRESSINGS) IMPLANT
GLOVE BIO SURGEON STRL SZ7 (GLOVE) ×3 IMPLANT
GLOVE BIOGEL PI IND STRL 7.5 (GLOVE) ×1 IMPLANT
GLOVE BIOGEL PI INDICATOR 7.5 (GLOVE) ×2
GOWN STRL REUS W/TWL LRG LVL3 (GOWN DISPOSABLE) ×3 IMPLANT
GOWN STRL REUS W/TWL XL LVL3 (GOWN DISPOSABLE) ×4 IMPLANT
KIT BASIN OR (CUSTOM PROCEDURE TRAY) ×3 IMPLANT
KIT TURNOVER KIT A (KITS) ×2 IMPLANT
NS IRRIG 1000ML POUR BTL (IV SOLUTION) ×3 IMPLANT
PENCIL SMOKE EVACUATOR (MISCELLANEOUS) IMPLANT
POUCH SPECIMEN RETRIEVAL 10MM (ENDOMECHANICALS) ×3 IMPLANT
PROTECTOR NERVE ULNAR (MISCELLANEOUS) IMPLANT
SCISSORS LAP 5X35 DISP (ENDOMECHANICALS) ×3 IMPLANT
SET CHOLANGIOGRAPH MIX (MISCELLANEOUS) ×3 IMPLANT
SET IRRIG TUBING LAPAROSCOPIC (IRRIGATION / IRRIGATOR) ×3 IMPLANT
SET TUBE SMOKE EVAC HIGH FLOW (TUBING) ×3 IMPLANT
SPONGE GAUZE 2X2 STER 10/PKG (GAUZE/BANDAGES/DRESSINGS)
STRIP CLOSURE SKIN 1/2X4 (GAUZE/BANDAGES/DRESSINGS) ×2 IMPLANT
SUT MNCRL AB 4-0 PS2 18 (SUTURE) ×6 IMPLANT
SYR 20ML LL LF (SYRINGE) IMPLANT
TAPE CLOTH 4X10 WHT NS (GAUZE/BANDAGES/DRESSINGS) IMPLANT
TOWEL OR 17X26 10 PK STRL BLUE (TOWEL DISPOSABLE) ×3 IMPLANT
TOWEL OR NON WOVEN STRL DISP B (DISPOSABLE) ×3 IMPLANT
TRAY LAPAROSCOPIC (CUSTOM PROCEDURE TRAY) ×3 IMPLANT
TROCAR BLADELESS OPT 5 100 (ENDOMECHANICALS) ×6 IMPLANT
TROCAR XCEL BLUNT TIP 100MML (ENDOMECHANICALS) ×3 IMPLANT
TROCAR XCEL NON-BLD 11X100MML (ENDOMECHANICALS) ×3 IMPLANT
WATER STERILE IRR 1000ML POUR (IV SOLUTION) ×1 IMPLANT

## 2019-12-20 NOTE — Progress Notes (Signed)
Pt alert and oriented, walking, tolerating diet. D/C instructions given, pt d/cd home.

## 2019-12-20 NOTE — Discharge Summary (Signed)
Physician Discharge Summary  Patient ID: Leslie Mathews MRN: [id number] DOB/AGE: 05/11/1975 44 y.o.  Admit date: 12/18/2019 Discharge date: 12/20/2019  Admission Diagnoses:acute choelcystitis   choledocholithiasis  Discharge Diagnoses: same Active Problems:   Choledocholithiasis with acute cholecystitis   Discharged Condition: good  Hospital Course: Originally scheduled for elective laparoscopic cholecystectomy in October, but presented to her PCP's office with worsening abdominal pain and jaundice.  She was found to have a bilirubin of 5.5.  She was admitted to the hospital.  MRCP showed inflammation of the gallbladder, no residual gallstones, and no sign of CBD obstruction.  Her jaundice is resolving.  She underwent laparoscopic cholecystectomy with IOC earlier today and is ready for discharge.  Consults: GI  Treatments: surgery: lap chole with IOC 9/16  Discharge Exam: Blood pressure 121/82, pulse 70, temperature 98.2 F (36.8 C), temperature source Oral, resp. rate (!) 23, height 5\' 6"  (1.676 m), weight 81.6 kg, SpO2 97 %. Constitutional:  WDWN in NAD, conversant, no obvious deformities; lying in bed comfortably Eyes:  Pupils equal, round; sclera minimally icteric; moist conjunctiva; no lid lag HENT:  Oral mucosa moist; good dentition  Neck:  No masses palpated, trachea midline; no thyromegaly Lungs:  CTA bilaterally; normal respiratory effort CV:  Regular rate and rhythm; no murmurs; extremities well-perfused with no edema Abd:  +bowel sounds, soft, minimal incisional tenderness; no palpable organomegaly; no palpable hernias Dressings c/d/i Musc:  Unable to assess gait; no apparent clubbing or cyanosis in extremities Lymphatic:  No palpable cervical or axillary lymphadenopathy Skin:  Warm, dry; no sign of jaundice Psychiatric - alert and oriented x 4; calm mood and affect  Disposition: Discharge disposition: 01-Home or Self Care       Discharge Instructions    Call  MD for:  persistant nausea and vomiting   Complete by: As directed    Call MD for:  redness, tenderness, or signs of infection (pain, swelling, redness, odor or green/yellow discharge around incision site)   Complete by: As directed    Call MD for:  severe uncontrolled pain   Complete by: As directed    Call MD for:  temperature >100.4   Complete by: As directed    Diet general   Complete by: As directed    Driving Restrictions   Complete by: As directed    Do not drive while taking pain medications   Increase activity slowly   Complete by: As directed    May shower / Bathe   Complete by: As directed      Allergies as of 12/20/2019      Reactions   Azithromycin    Rash- mild   Penicillins Rash   As a child      Medication List    STOP taking these medications   HYDROcodone-acetaminophen 5-325 MG tablet Commonly known as: NORCO/VICODIN   meloxicam 15 MG tablet Commonly known as: MOBIC     TAKE these medications   acetaminophen 325 MG tablet Commonly known as: TYLENOL You can take 2 tablets every 6 hours as needed for pain.  You can alternate with ibuprofen.  Do not take more than 4000 mg of Tylenol/acetaminophen per day, it can harm your liver.   ALPRAZolam 0.5 MG tablet Commonly known as: XANAX Pt takes 1/2 to 1 tab as needed, as directed by your primary care physicianl What changed:   how much to take  how to take this  when to take this  reasons to take this  additional instructions  olmesartan 20 MG tablet Commonly known as: BENICAR Take 20 mg by mouth daily.   oxyCODONE 5 MG immediate release tablet Commonly known as: Oxy IR/ROXICODONE Take 1 tablet (5 mg total) by mouth every 6 (six) hours as needed for severe pain.   sertraline 50 MG tablet Commonly known as: ZOLOFT Take 100 mg by mouth daily.       Follow-up Information    Manus Rudd, MD. Schedule an appointment as soon as possible for a visit in 3 weeks.   Specialty: General  Surgery Contact information: [address] [place] [address] [phone]        [facility] Follow up.   Specialty: Radiology Contact information: [address] [id number] mc [place] [address] [phone]              Signed: Wynona Luna 12/20/2019, 5:44 PM

## 2019-12-20 NOTE — Progress Notes (Signed)
Day of Surgery   Subjective/Chief Complaint: Patient reports much less tenderness Urine/ jaundice seem to be clearing    Objective: Vital signs in last 24 hours: Temp:  [97.7 F (36.5 C)-98.7 F (37.1 C)] 98.7 F (37.1 C) (09/16 0834) Pulse Rate:  [53-57] 54 (09/16 0834) Resp:  [16-17] 16 (09/16 0834) BP: (101-128)/(68-81) 118/74 (09/16 0834) SpO2:  [98 %-100 %] 100 % (09/16 0834) Last BM Date: 12/18/19  Intake/Output from previous day: 09/15 0701 - 09/16 0700 In: 340 [P.O.:240; IV Piggyback:100] Out: 1800 [Urine:1800] Intake/Output this shift: No intake/output data recorded.  General appearance: alert, cooperative and no visible jaundice GI: soft, minimal RUQ tenderness  Lab Results:  Recent Labs    12/18/19 1224 12/19/19 0341  WBC 8.4 8.7  HGB 13.3 11.9*  HCT 42.0 38.4  PLT 225 181   BMET Recent Labs    12/18/19 1224 12/19/19 0341  NA 141 135  K 4.1 3.6  CL 103 101  CO2 25 26  GLUCOSE 99 121*  BUN 12 7  CREATININE 0.55 0.61  CALCIUM 9.5 8.9   PT/INR Recent Labs    12/18/19 1523  LABPROT 12.3  INR 1.0   ABG No results for input(s): PHART, HCO3 in the last 72 hours.  Invalid input(s): PCO2, PO2  Studies/Results: MR 3D Recon At Scanner  Result Date: 12/19/2019 CLINICAL DATA:  Right upper quadrant abdominal pain. EXAM: MRI ABDOMEN WITHOUT AND WITH CONTRAST (INCLUDING MRCP) TECHNIQUE: Multiplanar multisequence MR imaging of the abdomen was performed both before and after the administration of intravenous contrast. Heavily T2-weighted images of the biliary and pancreatic ducts were obtained, and three-dimensional MRCP images were rendered by post processing. CONTRAST:  79mL GADAVIST GADOBUTROL 1 MMOL/ML IV SOLN COMPARISON:  12/11/2019 FINDINGS: Lower chest: The lung bases are clear of an acute process. No pleural or pericardial effusion. Hepatobiliary: 18 mm segment 4A liver lesion has typical MR findings for benign hepatic hemangioma. Bright T2 signal  intensity with lobulated appearance and subsequent peripheral nodular enhancement with complete filling in on delayed images. No further imaging evaluation is necessary. 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 No intrahepatic biliary dilatation is identified. There is moderate gallbladder wall thickening and pericholecystic fluid. There is also some fluid around the descending duodenum and in the right anterior pararenal space. I do not see any definite gallstones. Normal caliber and course of the common bile duct. No common bile duct stones. Findings could reflect a calculus cholecystitis. Nuclear medicine hepatic biliary scan may be helpful for further evaluation. Pancreas:  No mass, inflammation or ductal dilatation. Spleen:  Normal size. No focal lesions. Adrenals/Urinary Tract: The adrenal glands and kidneys are normal in stable. Small left renal cyst again noted. Stomach/Bowel: Stomach, duodenum, visualized small bowel and visualized colon are unremarkable. Vascular/Lymphatic: The aorta and branch vessels are normal. The major venous structures are patent. No mesenteric or retroperitoneal mass or adenopathy. Other:  No ascites or abdominal wall hernia. Musculoskeletal: No significant bony findings. IMPRESSION: 1. Moderate gallbladder wall thickening and pericholecystic fluid. Findings could reflect acalculus cholecystitis. No biliary dilatation or common bile duct stone. Nuclear medicine hepatic biliary scan may be helpful for further evaluation. 2. 18 mm segment 4A liver lesion has typical MR imaging features of a benign hepatic hemangioma. Electronically Signed   By: Rudie Meyer M.D.   On: 12/19/2019 07:49   MR ABDOMEN MRCP W WO CONTAST  Result  Date: 12/19/2019 CLINICAL DATA:  Right upper quadrant abdominal pain. EXAM: MRI ABDOMEN WITHOUT AND WITH CONTRAST (INCLUDING MRCP) TECHNIQUE: Multiplanar  multisequence MR imaging of the abdomen was performed both before and after the administration of intravenous contrast. Heavily T2-weighted images of the biliary and pancreatic ducts were obtained, and three-dimensional MRCP images were rendered by post processing. CONTRAST:  34mL GADAVIST GADOBUTROL 1 MMOL/ML IV SOLN COMPARISON:  12/11/2019 FINDINGS: Lower chest: The lung bases are clear of an acute process. No pleural or pericardial effusion. Hepatobiliary: 18 mm segment 4A liver lesion has typical MR findings for benign hepatic hemangioma. Bright T2 signal intensity with lobulated appearance and subsequent peripheral nodular enhancement with complete filling in on delayed images. No further imaging evaluation is necessary. 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 No intrahepatic biliary dilatation is identified. There is moderate gallbladder wall thickening and pericholecystic fluid. There is also some fluid around the descending duodenum and in the right anterior pararenal space. I do not see any definite gallstones. Normal caliber and course of the common bile duct. No common bile duct stones. Findings could reflect a calculus cholecystitis. Nuclear medicine hepatic biliary scan may be helpful for further evaluation. Pancreas:  No mass, inflammation or ductal dilatation. Spleen:  Normal size. No focal lesions. Adrenals/Urinary Tract: The adrenal glands and kidneys are normal in stable. Small left renal cyst again noted. Stomach/Bowel: Stomach, duodenum, visualized small bowel and visualized colon are unremarkable. Vascular/Lymphatic: The aorta and branch vessels are normal. The major venous structures are patent. No mesenteric or retroperitoneal mass or adenopathy. Other:  No ascites or abdominal wall hernia. Musculoskeletal: No significant bony findings. IMPRESSION: 1. Moderate gallbladder wall thickening and  pericholecystic fluid. Findings could reflect acalculus cholecystitis. No biliary dilatation or common bile duct stone. Nuclear medicine hepatic biliary scan may be helpful for further evaluation. 2. 18 mm segment 4A liver lesion has typical MR imaging features of a benign hepatic hemangioma. Electronically Signed   By: Rudie Meyer M.D.   On: 12/19/2019 07:49    Anti-infectives: Anti-infectives (From admission, onward)   Start     Dose/Rate Route Frequency Ordered Stop   12/19/19 1000  [MAR Hold]  cefTRIAXone (ROCEPHIN) 2 g in sodium chloride 0.9 % 100 mL IVPB        (MAR Hold since Thu 12/20/2019 at 0830.Hold Reason: Transfer to a Procedural area.)   2 g 200 mL/hr over 30 Minutes Intravenous Every 24 hours 12/19/19 0843     12/18/19 1500  ciprofloxacin (CIPRO) IVPB 400 mg  Status:  Discontinued        400 mg 200 mL/hr over 60 Minutes Intravenous Every 12 hours 12/18/19 1446 12/19/19 0842      Assessment/Plan: Acute cholecystitis Probable choledocholithiasis that has now passed  Plan laparoscopic cholecystectomy with IOC today.  The surgical procedure has been discussed with the patient.  Potential risks, benefits, alternative treatments, and expected outcomes have been explained.  All of the patient's questions at this time have been answered.  The likelihood of reaching the patient's treatment goal is good.  The patient understand the proposed surgical procedure and wishes to proceed.   LOS: 2 days    Wynona Luna 12/20/2019

## 2019-12-20 NOTE — Transfer of Care (Signed)
Immediate Anesthesia Transfer of Care Note  Patient: Leslie Mathews  Procedure(s) Performed: LAPAROSCOPIC CHOLECYSTECTOMY WITH INTRAOPERATIVE CHOLANGIOGRAM (N/A )  Patient Location: PACU  Anesthesia Type:General  Level of Consciousness: awake, alert  and oriented  Airway & Oxygen Therapy: Patient Spontanous Breathing and Patient connected to face mask oxygen  Post-op Assessment: Report given to RN and Post -op Vital signs reviewed and stable  Post vital signs: Reviewed and stable  Last Vitals:  Vitals Value Taken Time  BP 115/67 12/20/19 1046  Temp    Pulse 72 12/20/19 1047  Resp 24 12/20/19 1047  SpO2 100 % 12/20/19 1047  Vitals shown include unvalidated device data.  Last Pain:  Vitals:   12/20/19 0834  TempSrc: Oral  PainSc:       Patients Stated Pain Goal: 2 (12/19/19 1945)  Complications: No complications documented.

## 2019-12-20 NOTE — Op Note (Signed)
Laparoscopic Cholecystectomy with IOC Procedure Note  Indications: This patient presents with symptomatic gallbladder disease and jaundice.  MRCP showed that the common bile duct was free of obstruction, so she presents now for cholecystectomy.  Pre-operative Diagnosis: Calculus of gallbladder with acute cholecystitis, without mention of obstruction  Post-operative Diagnosis: Same  Surgeon: Wynona Luna   Resident Surgeon:  Louisa Second, PGY-7.   I was personally present during the key and critical portions of this procedure and immediately available throughout the entire procedure, as documented in my operative note.   Anesthesia: General endotracheal anesthesia  ASA Class: 2  Procedure Details  The patient was seen again in the Holding Room. The risks, benefits, complications, treatment options, and expected outcomes were discussed with the patient. The possibilities of reaction to medication, pulmonary aspiration, perforation of viscus, bleeding, recurrent infection, finding a normal gallbladder, the need for additional procedures, failure to diagnose a condition, the possible need to convert to an open procedure, and creating a complication requiring transfusion or operation were discussed with the patient. The likelihood of improving the patient's symptoms with return to their baseline status is good.  The patient and/or family concurred with the proposed plan, giving informed consent. The site of surgery properly noted. The patient was taken to Operating Room, identified as Leslie Mathews and the procedure verified as Laparoscopic Cholecystectomy with Intraoperative Cholangiogram. A Time Out was held and the above information confirmed.  Prior to the induction of general anesthesia, antibiotic prophylaxis was administered. General endotracheal anesthesia was then administered and tolerated well. After the induction, the abdomen was prepped with Chloraprep and draped in the sterile  fashion. The patient was positioned in the supine position.  Local anesthetic agent was injected into the skin near the umbilicus and an incision made. We dissected down to the abdominal fascia with blunt dissection.  The fascia was incised vertically and we entered the peritoneal cavity bluntly.  A pursestring suture of 0-Vicryl was placed around the fascial opening.  The Hasson cannula was inserted and secured with the stay suture.  Pneumoperitoneum was then created with CO2 and tolerated well without any adverse changes in the patient's vital signs. An 11-mm port was placed in the subxiphoid position.  Two 5-mm ports were placed in the right upper quadrant. All skin incisions were infiltrated with a local anesthetic agent before making the incision and placing the trocars.   We positioned the patient in reverse Trendelenburg, tilted slightly to the patient's left.  The gallbladder was identified, the fundus grasped and retracted cephalad. Adhesions were lysed bluntly and with the electrocautery where indicated, taking care not to injure any adjacent organs or viscus. The infundibulum was grasped and retracted laterally, exposing the peritoneum overlying the triangle of Calot. This was then divided and exposed in a blunt fashion. A critical view of the cystic duct and cystic artery was obtained.  The cystic duct was clearly identified and bluntly dissected circumferentially. The cystic duct was ligated with a clip distally.   An incision was made in the cystic duct and the [facility] cholangiogram catheter introduced. The catheter was secured using a clip. A cholangiogram was then obtained which showed good visualization of the distal and proximal biliary tree with no sign of filling defects or obstruction.  Contrast flowed easily into the duodenum. The catheter was then removed.   The cystic duct was then ligated with clips and divided. The cystic artery was identified, dissected free, ligated with clips and  divided as well.  The gallbladder was dissected from the liver bed in retrograde fashion with the electrocautery. The gallbladder was removed and placed in an Endocatch sac. The liver bed was irrigated and inspected. Hemostasis was achieved with the electrocautery. Copious irrigation was utilized and was repeatedly aspirated until clear.  The gallbladder and Endocatch sac were then removed through the umbilical port site.  The pursestring suture was used to close the umbilical fascia.    We again inspected the right upper quadrant for hemostasis.  Pneumoperitoneum was released as we removed the trocars.  4-0 Monocryl was used to close the skin.   Benzoin, steri-strips, and clean dressings were applied. The patient was then extubated and brought to the recovery room in stable condition. Instrument, sponge, and needle counts were correct at closure and at the conclusion of the case.   Findings: Cholecystitis with Cholelithiasis  Estimated Blood Loss: Minimal         Drains: none         Specimens: Gallbladder           Complications: None; patient tolerated the procedure well.         Disposition: PACU - hemodynamically stable.         Condition: stable  Wilmon Arms. Corliss Skains, MD, [facility]  General/ Trauma Surgery   12/20/2019 10:39 AM

## 2019-12-20 NOTE — Anesthesia Postprocedure Evaluation (Signed)
Anesthesia Post Note  Patient: Leslie Mathews  Procedure(s) Performed: LAPAROSCOPIC CHOLECYSTECTOMY WITH INTRAOPERATIVE CHOLANGIOGRAM (N/A )     Patient location during evaluation: PACU Anesthesia Type: General Level of consciousness: awake and alert Pain management: pain level controlled Vital Signs Assessment: post-procedure vital signs reviewed and stable Respiratory status: spontaneous breathing, nonlabored ventilation, respiratory function stable and patient connected to nasal cannula oxygen Cardiovascular status: blood pressure returned to baseline and stable Postop Assessment: no apparent nausea or vomiting Anesthetic complications: no   No complications documented.  Last Vitals:  Vitals:   12/20/19 1206 12/20/19 1259  BP: 104/68 117/70  Pulse: 62 61  Resp: 16 20  Temp: 36.5 C 37.1 C  SpO2: 98% 99%    Last Pain:  Vitals:   12/20/19 1303  TempSrc:   PainSc: 8                  Candra R Brady Schiller

## 2019-12-20 NOTE — Anesthesia Procedure Notes (Signed)
Procedure Name: Intubation Date/Time: 12/20/2019 9:39 AM Performed by: Talbot Grumbling, CRNA Pre-anesthesia Checklist: Emergency Drugs available, Suction available, Patient being monitored and Patient identified Patient Re-evaluated:Patient Re-evaluated prior to induction Oxygen Delivery Method: Circle system utilized Preoxygenation: Pre-oxygenation with 100% oxygen Induction Type: IV induction Ventilation: Mask ventilation without difficulty Laryngoscope Size: Mac and 3 Grade View: Grade I Tube type: Oral Tube size: 7.0 mm Number of attempts: 1 Airway Equipment and Method: Stylet Placement Confirmation: ETT inserted through vocal cords under direct vision,  positive ETCO2 and breath sounds checked- equal and bilateral Secured at: 21 cm Tube secured with: Tape Dental Injury: Teeth and Oropharynx as per pre-operative assessment

## 2019-12-20 NOTE — Discharge Instructions (Signed)
[facility] °LAPAROSCOPIC SURGERY: POST OP INSTRUCTIONS °Always review your discharge instruction sheet given to you by the facility where your surgery was performed. °IF YOU HAVE DISABILITY OR FAMILY LEAVE FORMS, YOU MUST BRING THEM TO THE OFFICE FOR PROCESSING.   °DO NOT GIVE THEM TO YOUR DOCTOR. ° °1. A prescription for pain medication will be given to you upon discharge.  Take your pain medication as prescribed, if needed.  If narcotic pain medicine is not needed, then you may take acetaminophen (Tylenol) or ibuprofen (Advil) as needed. °2. Take your usually prescribed medications unless otherwise directed. °3. If you need a refill on your pain medication, please contact your pharmacy.  They will contact our office to request authorization. Prescriptions will not be filled after 5pm or on week-ends. °4. You should follow a light diet the first few days after arrival home, such as soup and crackers, etc.  Be sure to include lots of fluids daily. °5. Most patients will experience some swelling and bruising in the area of the incisions.  Ice packs will help.  Swelling and bruising can take several days to resolve.  °6. It is common to experience some constipation if taking pain medication after surgery.  Increasing fluid intake and taking a stool softener (such as Colace) will usually help or prevent this problem from occurring.  A mild laxative (Milk of Magnesia or Miralax) should be taken according to package instructions if there are no bowel movements after 48 hours. °7. Unless discharge instructions indicate otherwise, you may remove your bandages 48 hours after surgery, and you may shower at that time.  You will have steri-strips (small skin tapes) in place directly over the incision.  These strips should be left on the skin for 7-10 days.  If your surgeon used skin glue on the incision, you may shower in 24 hours.  The glue will flake off over the next 2-3 weeks.  Any sutures or staples  will be removed at the office during your follow-up visit. °8. ACTIVITIES:  You may resume regular (light) daily activities beginning the next day--such as daily self-care, walking, climbing stairs--gradually increasing activities as tolerated.  You may have sexual intercourse when it is comfortable.  Refrain from any heavy lifting or straining until approved by your doctor. °a. You may drive when you are no longer taking prescription pain medication, you can comfortably wear a seatbelt, and you can safely maneuver your car and apply brakes. °b. RETURN TO WORK:   2-3 weeks °9. You should see your doctor in the office for a follow-up appointment approximately 2-3 weeks after your surgery.  Make sure that you call for this appointment within a day or two after you arrive home to insure a convenient appointment time. °10. OTHER INSTRUCTIONS: ________________________________________________________________________ °WHEN TO CALL YOUR DOCTOR: °1. Fever over 101.0 °2. Inability to urinate °3. Continued bleeding from incision. °4. Increased pain, redness, or drainage from the incision. °5. Increasing abdominal pain ° °The clinic staff is available to answer your questions during regular business hours.  Please don’t hesitate to call and ask to speak to one of the nurses for clinical concerns.  If you have a medical emergency, go to the nearest emergency room or call 911.  A surgeon from [facility] is always on call at the hospital. °[address], [address], [place], [place]  [address] ? [address] [address], [place],    [address] °[phone] ? [phone] ? FAX [phone] °Web site:   [email or web address] ° °

## 2019-12-20 NOTE — Anesthesia Preprocedure Evaluation (Addendum)
Anesthesia Evaluation  Patient identified by MRN, date of birth, ID band Patient awake    Reviewed: Allergy & Precautions, NPO status , Patient's Chart, lab work & pertinent test results  Airway Mallampati: I  TM Distance: >3 FB Neck ROM: Full    Dental  (+) Teeth Intact, Dental Advisory Given   Pulmonary former smoker,    breath sounds clear to auscultation       Cardiovascular hypertension, Pt. on medications  Rhythm:Regular Rate:Normal     Neuro/Psych PSYCHIATRIC DISORDERS Anxiety Depression negative neurological ROS     GI/Hepatic negative GI ROS, Neg liver ROS,   Endo/Other  negative endocrine ROS  Renal/GU negative Renal ROS     Musculoskeletal negative musculoskeletal ROS (+)   Abdominal Normal abdominal exam  (+)   Peds  Hematology   Anesthesia Other Findings   Reproductive/Obstetrics                            Anesthesia Physical Anesthesia Plan  ASA: II  Anesthesia Plan: General   Post-op Pain Management:    Induction: Intravenous  PONV Risk Score and Plan: 4 or greater and Ondansetron, Dexamethasone, Midazolam and Scopolamine patch - Pre-op  Airway Management Planned: Oral ETT  Additional Equipment: None  Intra-op Plan:   Post-operative Plan: Extubation in OR  Informed Consent: I have reviewed the patients History and Physical, chart, labs and discussed the procedure including the risks, benefits and alternatives for the proposed anesthesia with the patient or authorized representative who has indicated his/her understanding and acceptance.     Dental advisory given  Plan Discussed with: CRNA  Anesthesia Plan Comments: (Lab Results      Component                Value               Date                      WBC                      8.7                 12/19/2019                HGB                      11.9 (L)            12/19/2019                HCT                       38.4                12/19/2019                MCV                      88.3                12/19/2019                PLT                      181  12/19/2019            Lab Results      Component                Value               Date                      CREATININE               0.61                12/19/2019                BUN                      7                   12/19/2019                NA                       135                 12/19/2019                K                        3.6                 12/19/2019                CL                       101                 12/19/2019                CO2                      26                  12/19/2019            Covid-19 Nucleic Acid Test Results Lab Results      Component                Value               Date                      SARSCOV2NAA              NEGATIVE            12/18/2019           )       Anesthesia Quick Evaluation

## 2019-12-21 ENCOUNTER — Encounter (HOSPITAL_COMMUNITY): Payer: Self-pay | Admitting: Surgery

## 2019-12-21 LAB — SURGICAL PATHOLOGY

## 2020-06-17 ENCOUNTER — Other Ambulatory Visit: Payer: Self-pay | Admitting: Internal Medicine

## 2020-06-17 DIAGNOSIS — K769 Liver disease, unspecified: Secondary | ICD-10-CM

## 2020-06-26 ENCOUNTER — Ambulatory Visit
Admission: RE | Admit: 2020-06-26 | Discharge: 2020-06-26 | Disposition: A | Discharge status: Home / Self Care | Payer: BC Managed Care – PPO | Source: Ambulatory Visit | Attending: Internal Medicine | Admitting: Internal Medicine

## 2020-06-26 ENCOUNTER — Other Ambulatory Visit: Payer: Self-pay

## 2020-06-26 DIAGNOSIS — K769 Liver disease, unspecified: Secondary | ICD-10-CM

## 2020-06-26 MED ORDER — GADOBENATE DIMEGLUMINE 529 MG/ML IV SOLN
18.0000 mL | Freq: Once | INTRAVENOUS | Status: AC | PRN
  Administered 2020-06-26: 18 mL via INTRAVENOUS

## 2020-08-05 NOTE — Telephone Encounter (Signed)
See phone note

## 2020-12-03 ENCOUNTER — Other Ambulatory Visit (HOSPITAL_COMMUNITY): Payer: Self-pay

## 2021-12-14 ENCOUNTER — Other Ambulatory Visit: Payer: Self-pay | Admitting: Obstetrics and Gynecology

## 2021-12-14 DIAGNOSIS — R928 Other abnormal and inconclusive findings on diagnostic imaging of breast: Secondary | ICD-10-CM

## 2021-12-23 ENCOUNTER — Ambulatory Visit
Admission: RE | Admit: 2021-12-23 | Discharge: 2021-12-23 | Disposition: A | Discharge status: Home / Self Care | Payer: BC Managed Care – PPO | Source: Ambulatory Visit | Attending: Obstetrics and Gynecology | Admitting: Obstetrics and Gynecology

## 2021-12-23 ENCOUNTER — Other Ambulatory Visit: Payer: Self-pay | Admitting: Obstetrics and Gynecology

## 2021-12-23 DIAGNOSIS — N6310 Unspecified lump in the right breast, unspecified quadrant: Secondary | ICD-10-CM

## 2021-12-23 DIAGNOSIS — R928 Other abnormal and inconclusive findings on diagnostic imaging of breast: Secondary | ICD-10-CM

## 2022-01-01 ENCOUNTER — Ambulatory Visit
Admission: RE | Admit: 2022-01-01 | Discharge: 2022-01-01 | Disposition: A | Discharge status: Home / Self Care | Payer: BC Managed Care – PPO | Source: Ambulatory Visit | Attending: Obstetrics and Gynecology | Admitting: Obstetrics and Gynecology

## 2022-01-01 DIAGNOSIS — N6310 Unspecified lump in the right breast, unspecified quadrant: Secondary | ICD-10-CM

## 2022-05-31 ENCOUNTER — Other Ambulatory Visit: Payer: Self-pay | Admitting: Obstetrics and Gynecology

## 2022-05-31 DIAGNOSIS — N63 Unspecified lump in unspecified breast: Secondary | ICD-10-CM

## 2022-07-15 ENCOUNTER — Ambulatory Visit
Admission: RE | Admit: 2022-07-15 | Discharge: 2022-07-15 | Disposition: A | Discharge status: Home / Self Care | Payer: BC Managed Care – PPO | Source: Ambulatory Visit | Attending: Obstetrics and Gynecology | Admitting: Obstetrics and Gynecology

## 2022-07-15 DIAGNOSIS — N63 Unspecified lump in unspecified breast: Secondary | ICD-10-CM

## 2022-07-16 ENCOUNTER — Other Ambulatory Visit: Payer: Self-pay | Admitting: Obstetrics and Gynecology

## 2022-07-16 DIAGNOSIS — N6310 Unspecified lump in the right breast, unspecified quadrant: Secondary | ICD-10-CM

## 2022-10-11 ENCOUNTER — Telehealth: Payer: Self-pay

## 2022-10-11 NOTE — Telephone Encounter (Signed)
Tried to call patient to schedule appointment with Dr.Xu. LMOM that she can call back to schedule.

## 2022-10-19 ENCOUNTER — Other Ambulatory Visit (INDEPENDENT_AMBULATORY_CARE_PROVIDER_SITE_OTHER): Payer: BC Managed Care – PPO

## 2022-10-19 ENCOUNTER — Ambulatory Visit: Payer: BC Managed Care – PPO | Admitting: Orthopaedic Surgery

## 2022-10-19 ENCOUNTER — Encounter: Payer: Self-pay | Admitting: Physician Assistant

## 2022-10-19 DIAGNOSIS — M79641 Pain in right hand: Secondary | ICD-10-CM

## 2022-10-19 DIAGNOSIS — S92255A Nondisplaced fracture of navicular [scaphoid] of left foot, initial encounter for closed fracture: Secondary | ICD-10-CM | POA: Diagnosis not present

## 2022-10-19 DIAGNOSIS — M79672 Pain in left foot: Secondary | ICD-10-CM | POA: Diagnosis not present

## 2022-10-19 DIAGNOSIS — S63642A Sprain of metacarpophalangeal joint of left thumb, initial encounter: Secondary | ICD-10-CM

## 2022-10-19 NOTE — Progress Notes (Signed)
Office Visit Note   Patient: Leslie Mathews           Date of Birth: 09-05-1975           MRN: [id number] Visit Date: 10/19/2022              Requested by: Cleatis Polka., MD [address] [place],  [place] [address] PCP: Cleatis Polka., MD   Assessment & Plan: Visit Diagnoses:  1. Rupture of ulnar collateral ligament of left thumb, initial encounter   2. Closed nondisplaced fracture of navicular bone of left foot, initial encounter     Plan: Impression is 47 year old female with incidental finding of lesion on the left distal tibia, nondisplaced navicular fracture, right thumb UCL rupture.  In regards to the navicular fracture that should be amenable to nonoperative treatment.  We will place her back in the boot for weightbearing and ambulation.  We will evaluate the lesion with a CT scan.  In regards to the thumb injury this is consistent with a full-thickness rupture of the UCL.  Based on findings I recommended surgical repair.  Risk benefits prognosis reviewed with the patient.  [name] will call the patient to confirm surgery time.  Follow-Up Instructions: Return in about 4 weeks (around 11/16/2022).   Orders:  Orders Placed This Encounter  Procedures   XR Foot Complete Left   XR Ankle Complete Left   XR Finger Thumb Right   No orders of the defined types were placed in this encounter.     Procedures: No procedures performed   Clinical Data: No additional findings.   Subjective: Chief Complaint  Patient presents with   Left Foot - Pain   Right Hand - Pain    HPI patient is a pleasant 47 year old female who comes in today with left foot/ankle pain in addition to concerns about her distal tibia.  In regards to the left foot/ankle, she sustained a mechanical injury and believes around 10/07/2022.  She was seen in a clinical setting where x-rays were obtained.  They did not the a fracture but called her soon after to let her know there is a "spot  "that was concerning and she needed further evaluation.  She tells me that her dad died of bone cancer.  She is currently having pain to the anteromedial aspect of the midfoot.  Symptoms are worse with ambulation.  She does note that her pain has improved over time.  She is asymptomatic to the distal tibia which is where the area of concern is.  She denies any fevers or chills or any other history of cancer in herself.  She is also here to be evaluated for injury to her right thumb when she fell on her trip.  She states her thumb bent all the way back to the forearm.  She has been unable to hold a cup because the thumb is too loose.  Review of Systems  Constitutional: Negative.   HENT: Negative.    Eyes: Negative.   Respiratory: Negative.    Cardiovascular: Negative.   Endocrine: Negative.   Musculoskeletal: Negative.   Neurological: Negative.   Hematological: Negative.   Psychiatric/Behavioral: Negative.    All other systems reviewed and are negative.  as detailed in HPI.  All others reviewed and are negative.   Objective: Vital Signs: There were no vitals taken for this visit.  Physical Exam Vitals and nursing note reviewed.  Constitutional:      Appearance: She is  well-developed.  HENT:     Head: Atraumatic.     Nose: Nose normal.  Eyes:     Extraocular Movements: Extraocular movements intact.  Cardiovascular:     Pulses: Normal pulses.  Pulmonary:     Effort: Pulmonary effort is normal.  Abdominal:     Palpations: Abdomen is soft.  Musculoskeletal:     Cervical back: Neck supple.  Skin:    General: Skin is warm.     Capillary Refill: Capillary refill takes less than 2 seconds.  Neurological:     Mental Status: She is alert. Mental status is at baseline.  Psychiatric:        Behavior: Behavior normal.        Thought Content: Thought content normal.        Judgment: Judgment normal.    well-developed well-nourished female in no acute distress.  Alert and oriented x  3.  Ortho Exam left foot exam shows no swelling.  She does have mild to moderate tenderness to the anteromedial midfoot.  Slight pain with inversion.  No tenderness to the tibia.  She is neurovascularly intact distally.  Examination of the right thumb shows tenderness and swelling over the ulnar collateral ligament.  Testing of the UCL shows abnormal laxity greater than 20 degrees compared to the contralateral side.  Specialty Comments:  No specialty comments available.  Imaging: XR Foot Complete Left  Result Date: 10/19/2022 X-rays demonstrate transverse nondisplaced fracture of the navicular  XR Ankle Complete Left  Result Date: 10/19/2022 Small circular geographic nonaggressive appearing lesion of the distal tibia near the lateral cortex.  XR Finger Thumb Right  Result Date: 10/19/2022 No acute or structural abnormalities.    PMFS History: Patient Active Problem List   Diagnosis Date Noted   Choledocholithiasis with acute cholecystitis 12/18/2019   Ovarian cyst 02/12/2011   Past Medical History:  Diagnosis Date   Anemia    in teenage yrs   Anxiety    Depression in the past   Delivery normal    Depression    Heart palpitations    HTN (hypertension)    Ovarian cyst 02/12/2011    Family History  Problem Relation Age of Onset   Hypertension Mother    ALS Mother    Hyperlipidemia Mother    Diabetes Father    Multiple myeloma Father    Hyperlipidemia Brother    Hypertension Brother    Colon polyps Brother    Colon cancer Neg Hx    Rectal cancer Neg Hx     Past Surgical History:  Procedure Laterality Date   CHOLECYSTECTOMY N/A 12/20/2019   Procedure: LAPAROSCOPIC CHOLECYSTECTOMY WITH INTRAOPERATIVE CHOLANGIOGRAM;  Surgeon: Manus Rudd, MD;  Location: [facility];  Service: General;  Laterality: N/A;   CYSTECTOMY     FOOT FRACTURE SURGERY Left 02/09/2017   5th metatarsal   Social History   Occupational History   Occupation: Runner, broadcasting/film/video  Tobacco Use   Smoking  status: Former    Current packs/day: 0.00    Types: Cigarettes    Start date: 02/11/1997    Quit date: 04/06/2007    Years since quitting: 15.5   Smokeless tobacco: Never  Vaping Use   Vaping status: Never Used  Substance and Sexual Activity   Alcohol use: Yes    Alcohol/week: 7.0 standard drinks of alcohol    Types: 7 Standard drinks or equivalent per week    Comment: Glass of wine everyday   Drug use: No   Sexual activity: Yes

## 2022-10-19 NOTE — Addendum Note (Signed)
Addended by: Wendi Maya on: 10/19/2022 04:15 PM   Modules accepted: Orders

## 2022-10-29 ENCOUNTER — Ambulatory Visit
Admission: RE | Admit: 2022-10-29 | Discharge: 2022-10-29 | Disposition: A | Discharge status: Home / Self Care | Payer: BC Managed Care – PPO | Source: Ambulatory Visit | Attending: Physician Assistant | Admitting: Physician Assistant

## 2022-10-29 DIAGNOSIS — S92255A Nondisplaced fracture of navicular [scaphoid] of left foot, initial encounter for closed fracture: Secondary | ICD-10-CM

## 2022-11-03 ENCOUNTER — Encounter (HOSPITAL_BASED_OUTPATIENT_CLINIC_OR_DEPARTMENT_OTHER): Payer: Self-pay | Admitting: Orthopaedic Surgery

## 2022-11-03 ENCOUNTER — Other Ambulatory Visit: Payer: Self-pay

## 2022-11-03 NOTE — Progress Notes (Signed)
   11/03/22 1120  PAT Phone Screen  Is the patient taking a GLP-1 receptor agonist? No  Do You Have Diabetes? No  Do You Have Hypertension? Yes  Have You Ever Been to the ER for Asthma? No  Have You Taken Oral Steroids in the Past 3 Months? No  Do you Take Phenteramine or any Other Diet Drugs? No  Recent  Lab Work, EKG, CXR? No  Do you have a history of heart problems? (S)  Yes (heart palpitations and murmur monitored by PCP, pt denies cardiac sx is on olmesartan)  Have you ever had tests on your heart? No  Any Recent Hospitalizations? No  Height 5\' 6"  (1.676 m)  Weight 79.4 kg  Pat Appointment Scheduled Yes (EKG, BMP)

## 2022-11-04 ENCOUNTER — Encounter (HOSPITAL_BASED_OUTPATIENT_CLINIC_OR_DEPARTMENT_OTHER)
Admission: RE | Admit: 2022-11-04 | Discharge: 2022-11-04 | Disposition: A | Discharge status: Home / Self Care | Payer: BC Managed Care – PPO | Source: Ambulatory Visit | Attending: Orthopaedic Surgery | Admitting: Orthopaedic Surgery

## 2022-11-04 ENCOUNTER — Other Ambulatory Visit: Payer: Self-pay | Admitting: Physician Assistant

## 2022-11-04 DIAGNOSIS — Z01812 Encounter for preprocedural laboratory examination: Secondary | ICD-10-CM | POA: Diagnosis not present

## 2022-11-04 DIAGNOSIS — Z01818 Encounter for other preprocedural examination: Secondary | ICD-10-CM | POA: Diagnosis present

## 2022-11-04 DIAGNOSIS — Z01810 Encounter for preprocedural cardiovascular examination: Secondary | ICD-10-CM | POA: Diagnosis not present

## 2022-11-04 LAB — BASIC METABOLIC PANEL
Anion gap: 12 (ref 5–15)
BUN: 10 mg/dL (ref 6–20)
CO2: 21 mmol/L — ABNORMAL LOW (ref 22–32)
Calcium: 8.9 mg/dL (ref 8.9–10.3)
Chloride: 103 mmol/L (ref 98–111)
Creatinine, Ser: 0.83 mg/dL (ref 0.44–1.00)
GFR, Estimated: >60 mL/min (ref >60)
Glucose, Bld: 101 mg/dL — ABNORMAL HIGH (ref 70–99)
Potassium: 4 mmol/L (ref 3.5–5.1)
Sodium: 136 mmol/L (ref 135–145)

## 2022-11-04 MED ORDER — ONDANSETRON HCL 4 MG PO TABS: 4.0000 mg | ORAL_TABLET | Freq: Three times a day (TID) | ORAL | 0 refills | Status: AC | PRN

## 2022-11-04 MED ORDER — HYDROCODONE-ACETAMINOPHEN 5-325 MG PO TABS: 1.0000 | ORAL_TABLET | Freq: Three times a day (TID) | ORAL | 0 refills | Status: AC | PRN

## 2022-11-04 NOTE — Progress Notes (Signed)

## 2022-11-08 ENCOUNTER — Other Ambulatory Visit: Payer: Self-pay | Admitting: Physician Assistant

## 2022-11-08 NOTE — Progress Notes (Signed)
F/u to discuss

## 2022-11-10 ENCOUNTER — Encounter (HOSPITAL_BASED_OUTPATIENT_CLINIC_OR_DEPARTMENT_OTHER)
Admission: RE | Disposition: A | Discharge status: Home / Self Care | Payer: Self-pay | Source: Home / Self Care | Attending: Orthopaedic Surgery

## 2022-11-10 ENCOUNTER — Other Ambulatory Visit: Payer: Self-pay | Admitting: Physician Assistant

## 2022-11-10 ENCOUNTER — Ambulatory Visit (HOSPITAL_BASED_OUTPATIENT_CLINIC_OR_DEPARTMENT_OTHER)
Admission: RE | Admit: 2022-11-10 | Discharge: 2022-11-10 | Disposition: A | Discharge status: Home / Self Care | Payer: BC Managed Care – PPO | Attending: Orthopaedic Surgery | Admitting: Orthopaedic Surgery

## 2022-11-10 ENCOUNTER — Ambulatory Visit (HOSPITAL_BASED_OUTPATIENT_CLINIC_OR_DEPARTMENT_OTHER): Payer: BC Managed Care – PPO | Admitting: Anesthesiology

## 2022-11-10 ENCOUNTER — Ambulatory Visit (HOSPITAL_BASED_OUTPATIENT_CLINIC_OR_DEPARTMENT_OTHER): Payer: BC Managed Care – PPO

## 2022-11-10 ENCOUNTER — Encounter (HOSPITAL_BASED_OUTPATIENT_CLINIC_OR_DEPARTMENT_OTHER): Payer: Self-pay | Admitting: Orthopaedic Surgery

## 2022-11-10 ENCOUNTER — Other Ambulatory Visit: Payer: Self-pay

## 2022-11-10 DIAGNOSIS — X58XXXA Exposure to other specified factors, initial encounter: Secondary | ICD-10-CM | POA: Diagnosis not present

## 2022-11-10 DIAGNOSIS — S63641A Sprain of metacarpophalangeal joint of right thumb, initial encounter: Secondary | ICD-10-CM

## 2022-11-10 DIAGNOSIS — S63418A Traumatic rupture of collateral ligament of other finger at metacarpophalangeal and interphalangeal joint, initial encounter: Secondary | ICD-10-CM | POA: Diagnosis not present

## 2022-11-10 DIAGNOSIS — Z87891 Personal history of nicotine dependence: Secondary | ICD-10-CM | POA: Diagnosis not present

## 2022-11-10 DIAGNOSIS — Z01818 Encounter for other preprocedural examination: Secondary | ICD-10-CM

## 2022-11-10 DIAGNOSIS — I10 Essential (primary) hypertension: Secondary | ICD-10-CM

## 2022-11-10 HISTORY — DX: Cardiac murmur, unspecified: R01.1

## 2022-11-10 HISTORY — PX: ULNAR COLLATERAL LIGAMENT REPAIR: SHX6159

## 2022-11-10 LAB — POCT PREGNANCY, URINE: Preg Test, Ur: NEGATIVE

## 2022-11-10 SURGERY — REPAIR, LIGAMENT, ULNAR COLLATERAL
Anesthesia: Monitor Anesthesia Care | Site: Thumb | Laterality: Right

## 2022-11-10 MED ORDER — FENTANYL CITRATE (PF) 100 MCG/2ML IJ SOLN
INTRAMUSCULAR | Status: AC
  Filled 2022-11-10: qty 2

## 2022-11-10 MED ORDER — BUPIVACAINE HCL (PF) 0.25 % IJ SOLN
INTRAMUSCULAR | Status: AC
  Filled 2022-11-10: qty 30

## 2022-11-10 MED ORDER — ACETAMINOPHEN 500 MG PO TABS
ORAL_TABLET | ORAL | Status: AC
  Filled 2022-11-10: qty 2

## 2022-11-10 MED ORDER — LACTATED RINGERS IV SOLN: INTRAVENOUS | Status: DC

## 2022-11-10 MED ORDER — CEFAZOLIN SODIUM-DEXTROSE 2-4 GM/100ML-% IV SOLN
INTRAVENOUS | Status: AC
  Filled 2022-11-10: qty 100

## 2022-11-10 MED ORDER — PROPOFOL 500 MG/50ML IV EMUL
INTRAVENOUS | Status: DC | PRN
  Administered 2022-11-10: 150 ug/kg/min via INTRAVENOUS
  Administered 2022-11-10: 175 ug/kg/min via INTRAVENOUS

## 2022-11-10 MED ORDER — OXYCODONE-ACETAMINOPHEN 5-325 MG PO TABS: 1.0000 | ORAL_TABLET | Freq: Three times a day (TID) | ORAL | 0 refills | Status: AC | PRN

## 2022-11-10 MED ORDER — MIDAZOLAM HCL 2 MG/2ML IJ SOLN
INTRAMUSCULAR | Status: AC
  Filled 2022-11-10: qty 2

## 2022-11-10 MED ORDER — MIDAZOLAM HCL 2 MG/2ML IJ SOLN
2.0000 mg | Freq: Once | INTRAMUSCULAR | Status: AC
  Administered 2022-11-10: 2 mg via INTRAVENOUS

## 2022-11-10 MED ORDER — ROPIVACAINE HCL 5 MG/ML IJ SOLN
INTRAMUSCULAR | Status: DC | PRN
  Administered 2022-11-10: 25 mL via PERINEURAL

## 2022-11-10 MED ORDER — DEXAMETHASONE SODIUM PHOSPHATE 10 MG/ML IJ SOLN
INTRAMUSCULAR | Status: DC | PRN
  Administered 2022-11-10: 4 mg via INTRAVENOUS

## 2022-11-10 MED ORDER — FENTANYL CITRATE (PF) 100 MCG/2ML IJ SOLN
100.0000 ug | Freq: Once | INTRAMUSCULAR | Status: AC
  Administered 2022-11-10: 100 ug via INTRAVENOUS

## 2022-11-10 MED ORDER — CEFAZOLIN SODIUM-DEXTROSE 2-4 GM/100ML-% IV SOLN: 2.0000 g | INTRAVENOUS | Status: DC

## 2022-11-10 MED ORDER — CEFAZOLIN SODIUM-DEXTROSE 2-3 GM-%(50ML) IV SOLR
INTRAVENOUS | Status: DC | PRN
  Administered 2022-11-10: 2 g via INTRAVENOUS

## 2022-11-10 MED ORDER — ONDANSETRON HCL 4 MG/2ML IJ SOLN
INTRAMUSCULAR | Status: DC | PRN
  Administered 2022-11-10: 4 mg via INTRAVENOUS

## 2022-11-10 MED ORDER — ACETAMINOPHEN 500 MG PO TABS
1000.0000 mg | ORAL_TABLET | Freq: Once | ORAL | Status: AC
  Administered 2022-11-10: 1000 mg via ORAL

## 2022-11-10 MED ORDER — DEXAMETHASONE SODIUM PHOSPHATE 10 MG/ML IJ SOLN
INTRAMUSCULAR | Status: DC | PRN
  Administered 2022-11-10: 10 mg

## 2022-11-10 MED ORDER — FENTANYL CITRATE (PF) 100 MCG/2ML IJ SOLN: 25.0000 ug | INTRAMUSCULAR | Status: DC | PRN

## 2022-11-10 MED ORDER — DEXMEDETOMIDINE HCL IN NACL 80 MCG/20ML IV SOLN
INTRAVENOUS | Status: DC | PRN
  Administered 2022-11-10 (×3): 4 ug via INTRAVENOUS

## 2022-11-10 SURGICAL SUPPLY — 85 items
ADH SKN CLS APL DERMABOND .7 (GAUZE/BANDAGES/DRESSINGS) ×1
ANCH SUT 2-0 MN NDL DRL PLSTR (Anchor) ×2 IMPLANT
ANCHOR JUGGERKNOT 1.0 1DR 2-0 (Anchor) IMPLANT
BAG DECANTER FOR FLEXI CONT (MISCELLANEOUS) IMPLANT
BAND INSRT 18 STRL LF DISP RB (MISCELLANEOUS) ×1
BAND RUBBER #18 3X1/16 STRL (MISCELLANEOUS) ×1 IMPLANT
BLADE MINI RND TIP GREEN BEAV (BLADE) IMPLANT
BLADE SURG 15 STRL LF DISP TIS (BLADE) ×1 IMPLANT
BLADE SURG 15 STRL SS (BLADE) ×2
BNDG CMPR 5X2 KNTD ELC UNQ LF (GAUZE/BANDAGES/DRESSINGS)
BNDG CMPR 5X3 KNIT ELC UNQ LF (GAUZE/BANDAGES/DRESSINGS)
BNDG CMPR 5X4 KNIT ELC UNQ LF (GAUZE/BANDAGES/DRESSINGS)
BNDG CMPR 9X4 STRL LF SNTH (GAUZE/BANDAGES/DRESSINGS)
BNDG ELASTIC 2INX 5YD STR LF (GAUZE/BANDAGES/DRESSINGS) IMPLANT
BNDG ELASTIC 3INX 5YD STR LF (GAUZE/BANDAGES/DRESSINGS) IMPLANT
BNDG ELASTIC 4INX 5YD STR LF (GAUZE/BANDAGES/DRESSINGS) IMPLANT
BNDG ESMARK 4X9 LF (GAUZE/BANDAGES/DRESSINGS) IMPLANT
BNDG GAUZE DERMACEA FLUFF 4 (GAUZE/BANDAGES/DRESSINGS) ×1 IMPLANT
BNDG GZE DERMACEA 4 6PLY (GAUZE/BANDAGES/DRESSINGS) ×1
BRUSH SCRUB EZ PLAIN DRY (MISCELLANEOUS) ×1 IMPLANT
CLSR STERI-STRIP ANTIMIC 1/2X4 (GAUZE/BANDAGES/DRESSINGS) IMPLANT
CORD BIPOLAR FORCEPS 12FT (ELECTRODE) ×1 IMPLANT
COVER BACK TABLE 60X90IN (DRAPES) ×1 IMPLANT
COVER MAYO STAND STRL (DRAPES) ×1 IMPLANT
CUFF TOURN SGL QUICK 18X4 (TOURNIQUET CUFF) IMPLANT
DERMABOND ADVANCED .7 DNX12 (GAUZE/BANDAGES/DRESSINGS) ×1 IMPLANT
DRAPE EXTREMITY T 121X128X90 (DISPOSABLE) ×1 IMPLANT
DRAPE OEC MINIVIEW 54X84 (DRAPES) IMPLANT
DRAPE SURG 17X23 STRL (DRAPES) ×1 IMPLANT
GAUZE SPONGE 4X4 12PLY STRL (GAUZE/BANDAGES/DRESSINGS) ×1 IMPLANT
GAUZE XEROFORM 1X8 LF (GAUZE/BANDAGES/DRESSINGS) ×1 IMPLANT
GLOVE BIOGEL PI IND STRL 7.5 (GLOVE) ×1 IMPLANT
GLOVE ECLIPSE 7.0 STRL STRAW (GLOVE) ×1 IMPLANT
GLOVE INDICATOR 7.0 STRL GRN (GLOVE) ×1 IMPLANT
GLOVE SURG SYN 7.5 E (GLOVE) ×1
GLOVE SURG SYN 7.5 PF PI (GLOVE) ×1 IMPLANT
GOWN STRL REUS W/ TWL LRG LVL3 (GOWN DISPOSABLE) ×1 IMPLANT
GOWN STRL REUS W/ TWL XL LVL3 (GOWN DISPOSABLE) ×1 IMPLANT
GOWN STRL REUS W/TWL LRG LVL3 (GOWN DISPOSABLE) ×1
GOWN STRL REUS W/TWL XL LVL3 (GOWN DISPOSABLE) ×1
GOWN STRL SURGICAL XL XLNG (GOWN DISPOSABLE) ×1 IMPLANT
K-WIRE DBL .035X4 NSTRL (WIRE)
KWIRE DBL .035X4 NSTRL (WIRE) IMPLANT
LABEL IV YELLOW 96 HRS (LABEL) IMPLANT
NDL HYPO 25X1 1.5 SAFETY (NEEDLE) IMPLANT
NDL SAFETY ECLIP 18X1.5 (MISCELLANEOUS) IMPLANT
NEEDLE HYPO 25X1 1.5 SAFETY (NEEDLE)
NS IRRIG 1000ML POUR BTL (IV SOLUTION) ×1 IMPLANT
PACK BASIN DAY SURGERY FS (CUSTOM PROCEDURE TRAY) ×1 IMPLANT
PAD CAST 3X4 CTTN HI CHSV (CAST SUPPLIES) ×1 IMPLANT
PAD CAST 4YDX4 CTTN HI CHSV (CAST SUPPLIES) IMPLANT
PADDING CAST ABS COTTON 4X4 ST (CAST SUPPLIES) ×1 IMPLANT
PADDING CAST COTTON 3X4 STRL (CAST SUPPLIES) ×1
PADDING CAST COTTON 4X4 STRL (CAST SUPPLIES)
PADDING CAST SYNTHETIC 2X4 NS (CAST SUPPLIES) ×1 IMPLANT
PADDING CAST SYNTHETIC 3X4 NS (CAST SUPPLIES) IMPLANT
PADDING CAST SYNTHETIC 4X4 STR (CAST SUPPLIES) IMPLANT
PASSER SUT SWANSON 36MM LOOP (INSTRUMENTS) IMPLANT
SHEET MEDIUM DRAPE 40X70 STRL (DRAPES) ×1 IMPLANT
SLEEVE SCD COMPRESS KNEE MED (STOCKING) IMPLANT
SLING ARM FOAM STRAP LRG (SOFTGOODS) IMPLANT
SPIKE FLUID TRANSFER (MISCELLANEOUS) ×1 IMPLANT
SPLINT FIBERGLASS 3X35 (CAST SUPPLIES) IMPLANT
SPLINT FIBERGLASS 4X30 (CAST SUPPLIES) IMPLANT
SPLINT PLASTER CAST XFAST 3X15 (CAST SUPPLIES) IMPLANT
STOCKINETTE 4X48 STRL (DRAPES) ×1 IMPLANT
STRIP CLOSURE SKIN 1/2X4 (GAUZE/BANDAGES/DRESSINGS) IMPLANT
SUCTION TUBE FRAZIER 10FR DISP (SUCTIONS) ×1 IMPLANT
SUT ETHIBOND 3-0 V-5 (SUTURE) IMPLANT
SUT ETHILON 4 0 PS 2 18 (SUTURE) IMPLANT
SUT FIBERWIRE 3-0 18 TAPR NDL (SUTURE)
SUT MNCRL AB 3-0 PS2 27 (SUTURE) IMPLANT
SUT MNCRL AB 4-0 PS2 18 (SUTURE) IMPLANT
SUT SILK 4 0 PS 2 (SUTURE) IMPLANT
SUT VIC AB 2-0 SH 27 (SUTURE)
SUT VIC AB 2-0 SH 27XBRD (SUTURE) IMPLANT
SUT VIC AB 3-0 X1 27 (SUTURE) IMPLANT
SUT VIC AB 4-0 PS2 18 (SUTURE) IMPLANT
SUTURE FIBERWR 3-0 18 TAPR NDL (SUTURE) IMPLANT
SYR BULB EAR ULCER 3OZ GRN STR (SYRINGE) ×1 IMPLANT
SYR CONTROL 10ML LL (SYRINGE) IMPLANT
TOWEL GREEN STERILE FF (TOWEL DISPOSABLE) ×2 IMPLANT
TRAY DSU PREP LF (CUSTOM PROCEDURE TRAY) ×1 IMPLANT
TUBE CONNECTING 20X1/4 (TUBING) ×1 IMPLANT
UNDERPAD 30X36 HEAVY ABSORB (UNDERPADS AND DIAPERS) ×1 IMPLANT

## 2022-11-10 NOTE — Progress Notes (Signed)
Called [facility] and confirmed that they are out of hydrocodone 5/325 and are unable to get Rx locally in this area. Spoke with [place], [place] and will call in oxycodone to [facility] instead.

## 2022-11-10 NOTE — Anesthesia Postprocedure Evaluation (Signed)
Anesthesia Post Note  Patient: Leslie Mathews  Procedure(s) Performed: RIGHT THUMB ULNAR COLLATERAL LIGAMENT REPAIR (Right: Thumb)     Patient location during evaluation: PACU Anesthesia Type: Regional Level of consciousness: awake Pain management: pain level controlled Vital Signs Assessment: post-procedure vital signs reviewed and stable Respiratory status: spontaneous breathing, nonlabored ventilation and respiratory function stable Cardiovascular status: blood pressure returned to baseline and stable Postop Assessment: no apparent nausea or vomiting Anesthetic complications: no   No notable events documented.  Last Vitals:  Vitals:   11/10/22 1530 11/10/22 1544  BP: (!) 130/95 (!) 125/98  Pulse: (!) 56 65  Resp: 20   Temp:  (!) 36.1 C  SpO2: 96% 97%    Last Pain:  Vitals:   11/10/22 1544  TempSrc: Temporal  PainSc: 0-No pain                 Brittnae Aschenbrenner P Guerline Happ

## 2022-11-10 NOTE — Discharge Instructions (Addendum)
Postoperative instructions:  Dressing instructions: Keep your dressing and/or splint clean and dry at all times.  It will be removed at your first post-operative appointment.  Your stitches and/or staples will be removed at this visit.  Incision instructions:  Do not soak your incision for 3 weeks after surgery.  If the incision gets wet, pat dry and do not scrub the incision.  Pain control:  You have been given a prescription to be taken as directed for post-operative pain control.  In addition, elevate the operative extremity above the heart at all times to prevent swelling and throbbing pain.  Take over-the-counter Colace, 100mg  by mouth twice a day while taking narcotic pain medications to help prevent constipation.  Follow up appointments: 1) 7-10 days for suture removal and wound check. 2) Dr. Roda Shutters as scheduled.   -------------------------------------------------------------------------------------------------------------  After Surgery Pain Control:  After your surgery, post-surgical discomfort or pain is likely. This discomfort can last several days to a few weeks. At certain times of the day your discomfort may be more intense.  Did you receive a nerve block?  A nerve block can provide pain relief for one hour to two days after your surgery. As long as the nerve block is working, you will experience little or no sensation in the area the surgeon operated on.  As the nerve block wears off, you will begin to experience pain or discomfort. It is very important that you begin taking your prescribed pain medication before the nerve block fully wears off. Treating your pain at the first sign of the block wearing off will ensure your pain is better controlled and more tolerable when full-sensation returns. Do not wait until the pain is intolerable, as the medicine will be less effective. It is better to treat pain in advance than to try and catch up.  General Anesthesia:  If you did not  receive a nerve block during your surgery, you will need to start taking your pain medication shortly after your surgery and should continue to do so as prescribed by your surgeon.  Pain Medication:  Most commonly we prescribe Vicodin and Percocet for post-operative pain. Both of these medications contain a combination of acetaminophen (Tylenol) and a narcotic to help control pain.   It takes between 30 and 45 minutes before pain medication starts to work. It is important to take your medication before your pain level gets too intense.   Nausea is a common side effect of many pain medications. You will want to eat something before taking your pain medicine to help prevent nausea.   If you are taking a prescription pain medication that contains acetaminophen, we recommend that you do not take additional over the counter acetaminophen (Tylenol).  Other pain relieving options:   Using a cold pack to ice the affected area a few times a day (15 to 20 minutes at a time) can help to relieve pain, reduce swelling and bruising.   Elevation of the affected area can also help to reduce pain and swelling.   Post Anesthesia Home Care Instructions  Activity: Get plenty of rest for the remainder of the day. A responsible individual must stay with you for 24 hours following the procedure.  For the next 24 hours, DO NOT: -Drive a car -Advertising copywriter -Drink alcoholic beverages -Take any medication unless instructed by your physician -Make any legal decisions or sign important papers.  Meals: Start with liquid foods such as gelatin or soup. Progress to regular foods as  tolerated. Avoid greasy, spicy, heavy foods. If nausea and/or vomiting occur, drink only clear liquids until the nausea and/or vomiting subsides. Call your physician if vomiting continues.  Special Instructions/Symptoms: Your throat may feel dry or sore from the anesthesia or the breathing tube placed in your throat during surgery. If  this causes discomfort, gargle with warm salt water. The discomfort should disappear within 24 hours.  If you had a scopolamine patch placed behind your ear for the management of post- operative nausea and/or vomiting:  1. The medication in the patch is effective for 72 hours, after which it should be removed.  Wrap patch in a tissue and discard in the trash. Wash hands thoroughly with soap and water. 2. You may remove the patch earlier than 72 hours if you experience unpleasant side effects which may include dry mouth, dizziness or visual disturbances. 3. Avoid touching the patch. Wash your hands with soap and water after contact with the patch.    Regional Anesthesia Blocks  1. Numbness or the inability to move the "blocked" extremity may last from 3-48 hours after placement. The length of time depends on the medication injected and your individual response to the medication. If the numbness is not going away after 48 hours, call your surgeon.  2. The extremity that is blocked will need to be protected until the numbness is gone and the  Strength has returned. Because you cannot feel it, you will need to take extra care to avoid injury. Because it may be weak, you may have difficulty moving it or using it. You may not know what position it is in without looking at it while the block is in effect.  3. For blocks in the legs and feet, returning to weight bearing and walking needs to be done carefully. You will need to wait until the numbness is entirely gone and the strength has returned. You should be able to move your leg and foot normally before you try and bear weight or walk. You will need someone to be with you when you first try to ensure you do not fall and possibly risk injury.  4. Bruising and tenderness at the needle site are common side effects and will resolve in a few days.  5. Persistent numbness or new problems with movement should be communicated to the surgeon or the [facility] [phone] [facility] [phone]).   Next dose of Tylenol at 6pm if needed.

## 2022-11-10 NOTE — Progress Notes (Signed)
Assisted Dr. Lanetta Inch with right, supraclavicular, ultrasound guided block. Side rails up, monitors on throughout procedure. See vital signs in flow sheet. Tolerated Procedure well.

## 2022-11-10 NOTE — Op Note (Signed)
Date of Surgery: 11/10/2022  INDICATIONS: [name] is a 47 y.o.-year-old female with a right thumb UCL rupture.  The patient did consent to the procedure after discussion of the risks and benefits.  PREOPERATIVE DIAGNOSIS: Right thumb UCL rupture  POSTOPERATIVE DIAGNOSIS: same.  PROCEDURE: Repair of right thumb UCL rupture  SURGEON: N. Glee Arvin, M.D.  ASSIST: Starlyn Skeans [place], [place]; necessary for the timely completion of procedure and due to complexity of procedure.  ANESTHESIA:  regional, MAC  IV FLUIDS AND URINE: See anesthesia.  ESTIMATED BLOOD LOSS: minimal mL.  IMPLANTS:  Implant Name Type Inv. Item Serial No. Manufacturer Lot No. LRB No. Used Action  ANCH SUT 2-0 MN NDL DRL PLSTR - [id number] Anchor ANCH SUT 2-0 MN NDL DRL PLSTR  [employer](ORTH,TRAU,BIO,SG) [id number] Right 1 Implanted  ANCH SUT 2-0 MN NDL DRL PLSTR - [id number] Anchor ANCH SUT 2-0 MN NDL DRL PLSTR  [employer]IM[employer]ON(ORTH,TRAU,BIO,SG) [id number] Right 1 Implanted    DRAINS: None  COMPLICATIONS: see description of procedure.  DESCRIPTION OF PROCEDURE: The patient was brought to the operating room.  The patient had been signed prior to the procedure and this was documented. The patient had the anesthesia placed by the anesthesiologist.  A time-out was performed to confirm that this was the correct patient, site, side and location. The patient did receive antibiotics prior to the incision and was re-dosed during the procedure as needed at indicated intervals.  A tourniquet was placed.  The patient had the operative extremity prepped and draped in the standard surgical fashion.    A mid lateral incision on the ulnar aspect of the thumb MCP joint was made.  Dissection was carried down through the skin into the subcutaneous space.  Sensory nerves were identified and mobilized aside.  The soft tissues were cleared off of the adductor aponeurosis.  No Stener lesion was identified.  The adductor  aponeurosis was incised in line with the incision with a 15 blade to expose the underlying ulnar collateral ligament.  A complete rupture of the UCL from the metacarpal was encountered.  Stress testing of the ulnar collateral ligament showed gross instability to radial deviation of the thumb.  The end of the ligament was cleaned up with a 15 blade.  The footprint of the ligament on the metacarpal head was abraded with a 15 blade to promote healing within a bleeding bed of bone.  I performed a double row repair with 2 1.0 mm all suture anchors at the footprint of the UCL and the metacarpal head.  I was very happy with the apposition of the ligament back to the underlying bone.  Stress testing of the UCL was then performed with radial deviation which showed excellent stability.  Thumb flexion was also tested to be supple.  The surgical site was then thoroughly irrigated.  The abductor aponeurosis was closed with interrupted 4-0 Monocryl.  The remaining portions of the surgical site was closed in a usual layered fashion.  With 3-0 Monocryl and 4-0 Monocryl for the skin.  Steri-Strips were applied.  Sterile dressings were applied and a thumb spica splint was placed.  Patient tolerated the procedure well had no immediate complications.  Leslie Mathews was necessary for opening, closing, retracting, limb positioning and overall facilitation and timely completion of the procedure.  POSTOPERATIVE PLAN: Patient will be discharged home and follow-up in a week to begin hand therapy.  She will be fitted with a thermoplastic splint around that time.  Leslie Reel,  MD 5:07 PM

## 2022-11-10 NOTE — Anesthesia Preprocedure Evaluation (Addendum)
Anesthesia Evaluation  Patient identified by MRN, date of birth, ID band Patient awake    Reviewed: Allergy & Precautions, NPO status , Patient's Chart, lab work & pertinent test results  Airway Mallampati: I  TM Distance: >3 FB Neck ROM: Full    Dental no notable dental hx. (+) Teeth Intact, Dental Advisory Given   Pulmonary former smoker   Pulmonary exam normal breath sounds clear to auscultation       Cardiovascular hypertension, Pt. on medications Normal cardiovascular exam Rhythm:Regular Rate:Normal     Neuro/Psych  PSYCHIATRIC DISORDERS Anxiety Depression    negative neurological ROS     GI/Hepatic negative GI ROS, Neg liver ROS,,,  Endo/Other  negative endocrine ROS    Renal/GU negative Renal ROS  negative genitourinary   Musculoskeletal negative musculoskeletal ROS (+)    Abdominal   Peds  Hematology negative hematology ROS (+)   Anesthesia Other Findings   Reproductive/Obstetrics                             Anesthesia Physical Anesthesia Plan  ASA: 2  Anesthesia Plan: MAC and Regional   Post-op Pain Management: Regional block* and Tylenol PO (pre-op)*   Induction: Intravenous  PONV Risk Score and Plan: 2 and Propofol infusion, Treatment may vary due to age or medical condition, Midazolam, Dexamethasone and Ondansetron  Airway Management Planned: Natural Airway  Additional Equipment:   Intra-op Plan:   Post-operative Plan:   Informed Consent: I have reviewed the patients History and Physical, chart, labs and discussed the procedure including the risks, benefits and alternatives for the proposed anesthesia with the patient or authorized representative who has indicated his/her understanding and acceptance.     Dental advisory given  Plan Discussed with: CRNA  Anesthesia Plan Comments:        Anesthesia Quick Evaluation

## 2022-11-10 NOTE — Transfer of Care (Signed)
Immediate Anesthesia Transfer of Care Note  Patient: Leslie Mathews  Procedure(s) Performed: RIGHT THUMB ULNAR COLLATERAL LIGAMENT REPAIR (Right: Thumb)  Patient Location: PACU  Anesthesia Type:General  Level of Consciousness: awake, alert , and oriented  Airway & Oxygen Therapy: Patient Spontanous Breathing  Post-op Assessment: Report given to RN and Post -op Vital signs reviewed and stable  Post vital signs: Reviewed and stable  Last Vitals:  Vitals Value Taken Time  BP 121/82 11/10/22 1505  Temp    Pulse 69 11/10/22 1509  Resp 17 11/10/22 1509  SpO2 96 % 11/10/22 1509  Vitals shown include unfiled device data.  Last Pain:  Vitals:   11/10/22 1505  TempSrc:   PainSc: 0-No pain         Complications: No notable events documented.

## 2022-11-10 NOTE — Anesthesia Procedure Notes (Signed)
Anesthesia Regional Block: Supraclavicular block   Pre-Anesthetic Checklist: , timeout performed,  Correct Patient, Correct Site, Correct Laterality,  Correct Procedure, Correct Position, site marked,  Risks and benefits discussed,  Pre-op evaluation,  At surgeon's request and post-op pain management  Laterality: Right  Prep: Maximum Sterile Barrier Precautions used, chloraprep       Needles:  Injection technique: Single-shot  Needle Type: Echogenic Stimulator Needle     Needle Length: 5cm  Needle Gauge: 21     Additional Needles:   Procedures:,,,, ultrasound used (permanent image in chart),,    Narrative:  Start time: 11/10/2022 12:50 PM End time: 11/10/2022 12:53 PM Injection made incrementally with aspirations every 5 mL. Anesthesiologist: Elmer Picker, MD

## 2022-11-10 NOTE — H&P (Signed)
PREOPERATIVE H&P  Chief Complaint: right thumb ulnar collateral ligament rupture  HPI: Leslie Mathews is a 47 y.o. female who presents for surgical treatment of right thumb ulnar collateral ligament rupture.  She denies any changes in medical history.  Past Medical History:  Diagnosis Date   Anemia    in teenage yrs   Anxiety    Depression in the past   Delivery normal    Depression    Heart murmur    Heart palpitations    HTN (hypertension)    Ovarian cyst 02/12/2011   Past Surgical History:  Procedure Laterality Date   CHOLECYSTECTOMY N/A 12/20/2019   Procedure: LAPAROSCOPIC CHOLECYSTECTOMY WITH INTRAOPERATIVE CHOLANGIOGRAM;  Surgeon: Manus Rudd, MD;  Location: [facility];  Service: General;  Laterality: N/A;   CYSTECTOMY     FOOT FRACTURE SURGERY Left 02/09/2017   5th metatarsal   Social History   Socioeconomic History   Marital status: Married    Spouse name: Not on file   Number of children: 1   Years of education: Not on file   Highest education level: Not on file  Occupational History   Occupation: teacher  Tobacco Use   Smoking status: Former    Current packs/day: 0.00    Types: Cigarettes    Start date: 02/11/1997    Quit date: 04/06/2007    Years since quitting: 15.6   Smokeless tobacco: Never  Vaping Use   Vaping status: Never Used  Substance and Sexual Activity   Alcohol use: Yes    Alcohol/week: 7.0 standard drinks of alcohol    Types: 7 Standard drinks or equivalent per week    Comment: Glass of wine everyday   Drug use: Yes    Types: Marijuana   Sexual activity: Yes  Other Topics Concern   Not on file  Social History Narrative   Not on file   Social Determinants of Health   Financial Resource Strain: Not on file  Food Insecurity: Not on file  Transportation Needs: Not on file  Physical Activity: Not on file  Stress: Not on file  Social Connections: Not on file   Family History  Problem Relation Age of Onset    Hypertension Mother    ALS Mother    Hyperlipidemia Mother    Diabetes Father    Multiple myeloma Father    Hyperlipidemia Brother    Hypertension Brother    Colon polyps Brother    Colon cancer Neg Hx    Rectal cancer Neg Hx    Allergies  Allergen Reactions   Azithromycin     Rash- mild   Penicillins Rash    As a child   Prior to Admission medications   Medication Sig Start Date End Date Taking? Authorizing Provider  HYDROcodone-acetaminophen (NORCO) 5-325 MG tablet Take 1 tablet by mouth 3 (three) times daily as needed. To be taken after surgery 11/04/22   Cristie Hem, PA-C  norgestrel-ethinyl estradiol (LO/OVRAL) 0.3-30 MG-MCG tablet Take 1 tablet by mouth daily.   Yes [provider]  olmesartan (BENICAR) 20 MG tablet Take 20 mg by mouth daily. 10/01/19  Yes [provider]  ondansetron (ZOFRAN) 4 MG tablet Take 1 tablet (4 mg total) by mouth every 8 (eight) hours as needed for nausea or vomiting. 11/04/22   Cristie Hem, PA-C  sertraline (ZOLOFT) 50 MG tablet Take 100 mg by mouth daily.    Yes [provider]  ALPRAZolam Prudy Feeler) 0.5 MG tablet Pt  takes 1/2 to 1 tab as needed, as directed by your primary care physicianl 12/20/19   Sherrie George, PA-C     Positive ROS: All other systems have been reviewed and were otherwise negative with the exception of those mentioned in the HPI and as above.  Physical Exam: General: Alert, no acute distress Cardiovascular: No pedal edema Respiratory: No cyanosis, no use of accessory musculature GI: abdomen soft Skin: No lesions in the area of chief complaint Neurologic: Sensation intact distally Psychiatric: Patient is competent for consent with normal mood and affect Lymphatic: no lymphedema  MUSCULOSKELETAL: exam stable  Assessment: right thumb ulnar collateral ligament rupture  Plan: Plan for Procedure(s): RIGHT THUMB ULNAR COLLATERAL LIGAMENT REPAIR  The risks benefits and alternatives were  discussed with the patient including but not limited to the risks of nonoperative treatment, versus surgical intervention including infection, bleeding, nerve injury,  blood clots, cardiopulmonary complications, morbidity, mortality, among others, and they were willing to proceed.   Glee Arvin, MD 11/10/2022 11:54 AM

## 2022-11-11 ENCOUNTER — Encounter (HOSPITAL_BASED_OUTPATIENT_CLINIC_OR_DEPARTMENT_OTHER): Payer: Self-pay | Admitting: Orthopaedic Surgery

## 2022-11-17 ENCOUNTER — Ambulatory Visit (INDEPENDENT_AMBULATORY_CARE_PROVIDER_SITE_OTHER): Payer: BC Managed Care – PPO | Admitting: Physician Assistant

## 2022-11-17 ENCOUNTER — Encounter: Payer: Self-pay | Admitting: Physician Assistant

## 2022-11-17 DIAGNOSIS — S63641A Sprain of metacarpophalangeal joint of right thumb, initial encounter: Secondary | ICD-10-CM

## 2022-11-17 NOTE — Addendum Note (Signed)
Addended by: Wendi Maya on: 11/17/2022 11:25 AM   Modules accepted: Orders

## 2022-11-17 NOTE — Progress Notes (Signed)
Post-Op Visit Note   Patient: Leslie Mathews           Date of Birth: July 01, 1975           MRN: [id number] Visit Date: 11/17/2022 PCP: Cleatis Polka., MD   Assessment & Plan:  Chief Complaint:  Chief Complaint  Patient presents with   Right Hand - Follow-up    Right thumb UCL repair 11/10/2022   Visit Diagnoses:  1. Rupture of ulnar collateral ligament of thumb, right, initial encounter     Plan: Patient is a pleasant 47 year old female who comes in today 1 week status post repair right thumb UCL rupture, date of surgery 11/10/2022.  She has been compliant in her thumb spica splint.  She is not in any pain.  She denies any numbness, tingling or burning to the right hand.  Examination of the right hand reveals a well-healed surgical incision.  Steri-Strips in place.  Full sensation to the hand and all 5 fingers.  She is neurovascularly intact distally.  At this point, therapy is unable to get her in until September 5.  We will place her in a thumb spica cast today.  She will follow-up with Dr. Roda Shutters next week to have this removed and possibly transition into a splint until therapy can see her and get her fitted for a thermoplastic splint.  Follow-Up Instructions: Return in about 1 week (around 11/24/2022).   Orders:  No orders of the defined types were placed in this encounter.  No orders of the defined types were placed in this encounter.   Imaging: No new imaging  PMFS History: Patient Active Problem List   Diagnosis Date Noted   Rupture of ulnar collateral ligament of thumb, right, initial encounter 11/10/2022   Choledocholithiasis with acute cholecystitis 12/18/2019   Ovarian cyst 02/12/2011   Past Medical History:  Diagnosis Date   Anemia    in teenage yrs   Anxiety    Depression in the past   Delivery normal    Depression    Heart murmur    Heart palpitations    HTN (hypertension)    Ovarian cyst 02/12/2011    Family History  Problem Relation Age  of Onset   Hypertension Mother    ALS Mother    Hyperlipidemia Mother    Diabetes Father    Multiple myeloma Father    Hyperlipidemia Brother    Hypertension Brother    Colon polyps Brother    Colon cancer Neg Hx    Rectal cancer Neg Hx     Past Surgical History:  Procedure Laterality Date   CHOLECYSTECTOMY N/A 12/20/2019   Procedure: LAPAROSCOPIC CHOLECYSTECTOMY WITH INTRAOPERATIVE CHOLANGIOGRAM;  Surgeon: Manus Rudd, MD;  Location: [facility];  Service: General;  Laterality: N/A;   CYSTECTOMY     FOOT FRACTURE SURGERY Left 02/09/2017   5th metatarsal   ULNAR COLLATERAL LIGAMENT REPAIR Right 11/10/2022   Procedure: RIGHT THUMB ULNAR COLLATERAL LIGAMENT REPAIR;  Surgeon: Tarry Kos, MD;  Location: [facility];  Service: Orthopedics;  Laterality: Right;   Social History   Occupational History   Occupation: Runner, broadcasting/film/video  Tobacco Use   Smoking status: Former    Current packs/day: 0.00    Types: Cigarettes    Start date: 02/11/1997    Quit date: 04/06/2007    Years since quitting: 15.6   Smokeless tobacco: Never  Vaping Use   Vaping status: Never Used  Substance and Sexual Activity  Alcohol use: Yes    Alcohol/week: 7.0 standard drinks of alcohol    Types: 7 Standard drinks or equivalent per week    Comment: Glass of wine everyday   Drug use: Yes    Types: Marijuana   Sexual activity: Yes

## 2022-11-25 ENCOUNTER — Ambulatory Visit (INDEPENDENT_AMBULATORY_CARE_PROVIDER_SITE_OTHER): Payer: BC Managed Care – PPO | Admitting: Orthopaedic Surgery

## 2022-11-25 ENCOUNTER — Encounter: Payer: Self-pay | Admitting: Orthopaedic Surgery

## 2022-11-25 DIAGNOSIS — S63641A Sprain of metacarpophalangeal joint of right thumb, initial encounter: Secondary | ICD-10-CM

## 2022-11-25 NOTE — Progress Notes (Signed)
Post-Op Visit Note   Patient: Leslie Mathews           Date of Birth: May 11, 1975           MRN: [id number] Visit Date: 11/25/2022 PCP: Leslie Polka., MD   Assessment & Plan:  Chief Complaint:  Chief Complaint  Patient presents with   Right Hand - Follow-up    Right thumb UCL repair 11/10/2022   Visit Diagnoses:  1. Rupture of ulnar collateral ligament of thumb, right, initial encounter     Plan: [name] returns today for 2-week postop check.  She is doing well.  Reports some numbness to the thumb but no pain.  Examination of the right thumb shows healed surgical incision.  Expected diffuse swelling to the thumb without any neurovascular compromise.  Normal capillary refill.  UCL repair feels solid.  We reapplied new Steri-Strips today.  She has an appointment this morning with hand therapy for thermoplastic hand-based splint.  She can begin thumb extension and flexion range of motion but no pinching or grasping or stressing the UCL for 6 weeks.  Recheck in 4 weeks.  Follow-Up Instructions: Return in about 4 weeks (around 12/23/2022).   Orders:  No orders of the defined types were placed in this encounter.  No orders of the defined types were placed in this encounter.   Imaging: No results found.  PMFS History: Patient Active Problem List   Diagnosis Date Noted   Rupture of ulnar collateral ligament of thumb, right, initial encounter 11/10/2022   Choledocholithiasis with acute cholecystitis 12/18/2019   Ovarian cyst 02/12/2011   Past Medical History:  Diagnosis Date   Anemia    in teenage yrs   Anxiety    Depression in the past   Delivery normal    Depression    Heart murmur    Heart palpitations    HTN (hypertension)    Ovarian cyst 02/12/2011    Family History  Problem Relation Age of Onset   Hypertension Mother    ALS Mother    Hyperlipidemia Mother    Diabetes Father    Multiple myeloma Father    Hyperlipidemia Brother     Hypertension Brother    Colon polyps Brother    Colon cancer Neg Hx    Rectal cancer Neg Hx     Past Surgical History:  Procedure Laterality Date   CHOLECYSTECTOMY N/A 12/20/2019   Procedure: LAPAROSCOPIC CHOLECYSTECTOMY WITH INTRAOPERATIVE CHOLANGIOGRAM;  Surgeon: Manus Rudd, MD;  Location: [facility];  Service: General;  Laterality: N/A;   CYSTECTOMY     FOOT FRACTURE SURGERY Left 02/09/2017   5th metatarsal   ULNAR COLLATERAL LIGAMENT REPAIR Right 11/10/2022   Procedure: RIGHT THUMB ULNAR COLLATERAL LIGAMENT REPAIR;  Surgeon: Tarry Kos, MD;  Location: [facility];  Service: Orthopedics;  Laterality: Right;   Social History   Occupational History   Occupation: Runner, broadcasting/film/video  Tobacco Use   Smoking status: Former    Current packs/day: 0.00    Types: Cigarettes    Start date: 02/11/1997    Quit date: 04/06/2007    Years since quitting: 15.6   Smokeless tobacco: Never  Vaping Use   Vaping status: Never Used  Substance and Sexual Activity   Alcohol use: Yes    Alcohol/week: 7.0 standard drinks of alcohol    Types: 7 Standard drinks or equivalent per week    Comment: Glass of wine everyday   Drug use: Yes    Types:  Marijuana   Sexual activity: Yes

## 2022-12-23 ENCOUNTER — Ambulatory Visit (INDEPENDENT_AMBULATORY_CARE_PROVIDER_SITE_OTHER): Payer: BC Managed Care – PPO | Admitting: Physician Assistant

## 2022-12-23 DIAGNOSIS — S63641A Sprain of metacarpophalangeal joint of right thumb, initial encounter: Secondary | ICD-10-CM

## 2022-12-23 NOTE — Progress Notes (Signed)
Post-Op Visit Note   Patient: Leslie Mathews           Date of Birth: Aug 06, 1975           MRN: [id number] Visit Date: 12/23/2022 PCP: Cleatis Polka., MD   Assessment & Plan:  Chief Complaint:  Chief Complaint  Patient presents with   Right Thumb - Routine Post Op    UCL Repair 11/10/22   Visit Diagnoses:  1. Rupture of ulnar collateral ligament of thumb, right, initial encounter     Plan: Patient is a pleasant 47 year old female who comes in today 6 weeks status post repair right thumb UCL rupture, date of surgery 11/10/2022.  She notes some soreness and swelling but not actual pain.  She has been mostly compliant wearing her thermoplastic splint.  She has been to OT 2 additional times after the splint fitting.  She has been working on a home exercise program where she has regained most of her range of motion.  Examination of the right thumb reveals slight tenderness.  She does have swelling throughout.  Full range of motion with flexion, extension, opposition and abduction.  She is neurovascularly intact distally.  At this point, we would like to keep her in the thermoplastic splint until she is 12 weeks postop.  She may begin strengthening exercises and therapy.  She will follow-up with [place] in 6 weeks for recheck.  Follow-Up Instructions: Return in about 6 weeks (around 02/03/2023).   Orders:  No orders of the defined types were placed in this encounter.  No orders of the defined types were placed in this encounter.   Imaging: No results found.  PMFS History: Patient Active Problem List   Diagnosis Date Noted   Rupture of ulnar collateral ligament of thumb, right, initial encounter 11/10/2022   Choledocholithiasis with acute cholecystitis 12/18/2019   Ovarian cyst 02/12/2011   Past Medical History:  Diagnosis Date   Anemia    in teenage yrs   Anxiety    Depression in the past   Delivery normal    Depression    Heart murmur    Heart palpitations     HTN (hypertension)    Ovarian cyst 02/12/2011    Family History  Problem Relation Age of Onset   Hypertension Mother    ALS Mother    Hyperlipidemia Mother    Diabetes Father    Multiple myeloma Father    Hyperlipidemia Brother    Hypertension Brother    Colon polyps Brother    Colon cancer Neg Hx    Rectal cancer Neg Hx     Past Surgical History:  Procedure Laterality Date   CHOLECYSTECTOMY N/A 12/20/2019   Procedure: LAPAROSCOPIC CHOLECYSTECTOMY WITH INTRAOPERATIVE CHOLANGIOGRAM;  Surgeon: Manus Rudd, MD;  Location: [facility];  Service: General;  Laterality: N/A;   CYSTECTOMY     FOOT FRACTURE SURGERY Left 02/09/2017   5th metatarsal   ULNAR COLLATERAL LIGAMENT REPAIR Right 11/10/2022   Procedure: RIGHT THUMB ULNAR COLLATERAL LIGAMENT REPAIR;  Surgeon: Tarry Kos, MD;  Location: [facility];  Service: Orthopedics;  Laterality: Right;   Social History   Occupational History   Occupation: Runner, broadcasting/film/video  Tobacco Use   Smoking status: Former    Current packs/day: 0.00    Types: Cigarettes    Start date: 02/11/1997    Quit date: 04/06/2007    Years since quitting: 15.7   Smokeless tobacco: Never  Vaping Use   Vaping status: Never  Used  Substance and Sexual Activity   Alcohol use: Yes    Alcohol/week: 7.0 standard drinks of alcohol    Types: 7 Standard drinks or equivalent per week    Comment: Glass of wine everyday   Drug use: Yes    Types: Marijuana   Sexual activity: Yes
